# Patient Record
Sex: Female | Born: 1935
Health system: Southern US, Community
[De-identification: ages and names within clinical notes are randomized; demographics above are authoritative.]

## PROBLEM LIST (undated history)

## (undated) DIAGNOSIS — E785 Hyperlipidemia, unspecified: Secondary | ICD-10-CM

## (undated) DIAGNOSIS — F32A Depression, unspecified: Secondary | ICD-10-CM

## (undated) DIAGNOSIS — T7840XA Allergy, unspecified, initial encounter: Secondary | ICD-10-CM

## (undated) DIAGNOSIS — E079 Disorder of thyroid, unspecified: Secondary | ICD-10-CM

## (undated) DIAGNOSIS — K219 Gastro-esophageal reflux disease without esophagitis: Secondary | ICD-10-CM

## (undated) DIAGNOSIS — N952 Postmenopausal atrophic vaginitis: Secondary | ICD-10-CM

## (undated) DIAGNOSIS — R0602 Shortness of breath: Secondary | ICD-10-CM

## (undated) DIAGNOSIS — I10 Essential (primary) hypertension: Secondary | ICD-10-CM

## (undated) DIAGNOSIS — M170 Bilateral primary osteoarthritis of knee: Secondary | ICD-10-CM

## (undated) DIAGNOSIS — R7309 Other abnormal glucose: Secondary | ICD-10-CM

## (undated) DIAGNOSIS — N393 Stress incontinence (female) (male): Secondary | ICD-10-CM

## (undated) DIAGNOSIS — F329 Major depressive disorder, single episode, unspecified: Secondary | ICD-10-CM

## (undated) DIAGNOSIS — F419 Anxiety disorder, unspecified: Secondary | ICD-10-CM

## (undated) DIAGNOSIS — M1990 Unspecified osteoarthritis, unspecified site: Secondary | ICD-10-CM

## (undated) HISTORY — DX: Unspecified osteoarthritis, unspecified site: M19.90

## (undated) HISTORY — DX: Hyperlipidemia, unspecified: E78.5

## (undated) HISTORY — DX: Bilateral primary osteoarthritis of knee: M17.0

## (undated) HISTORY — DX: Postmenopausal atrophic vaginitis: N95.2

## (undated) HISTORY — DX: Stress incontinence (female) (male): N39.3

## (undated) HISTORY — DX: Anxiety disorder, unspecified: F41.9

## (undated) HISTORY — DX: Other abnormal glucose: R73.09

## (undated) HISTORY — DX: Shortness of breath: R06.02

## (undated) HISTORY — DX: Essential (primary) hypertension: I10

## (undated) HISTORY — DX: Depression, unspecified: F32.A

## (undated) HISTORY — PX: ABDOMINAL HYSTERECTOMY: SHX81

## (undated) HISTORY — DX: Disorder of thyroid, unspecified: E07.9

## (undated) HISTORY — DX: Gastro-esophageal reflux disease without esophagitis: K21.9

## (undated) HISTORY — DX: Major depressive disorder, single episode, unspecified: F32.9

## (undated) HISTORY — DX: Allergy, unspecified, initial encounter: T78.40XA

---

## 2004-09-04 ENCOUNTER — Ambulatory Visit: Payer: Self-pay | Admitting: Unknown Physician Specialty

## 2005-02-21 ENCOUNTER — Ambulatory Visit: Payer: Self-pay | Admitting: Internal Medicine

## 2005-09-29 ENCOUNTER — Ambulatory Visit: Payer: Self-pay

## 2005-09-30 ENCOUNTER — Ambulatory Visit: Payer: Self-pay | Admitting: Internal Medicine

## 2006-03-12 ENCOUNTER — Ambulatory Visit: Payer: Self-pay | Admitting: Internal Medicine

## 2006-03-16 ENCOUNTER — Ambulatory Visit: Payer: Self-pay | Admitting: Internal Medicine

## 2007-03-11 HISTORY — PX: ROTATOR CUFF REPAIR: SHX139

## 2007-04-09 ENCOUNTER — Ambulatory Visit: Payer: Self-pay | Admitting: Unknown Physician Specialty

## 2007-06-03 ENCOUNTER — Ambulatory Visit: Payer: Self-pay | Admitting: Internal Medicine

## 2007-08-17 ENCOUNTER — Ambulatory Visit: Payer: Self-pay | Admitting: Rheumatology

## 2007-11-08 ENCOUNTER — Ambulatory Visit: Payer: Self-pay | Admitting: Orthopedic Surgery

## 2007-11-11 ENCOUNTER — Ambulatory Visit: Payer: Self-pay | Admitting: Orthopedic Surgery

## 2007-11-11 ENCOUNTER — Other Ambulatory Visit: Payer: Self-pay

## 2007-12-03 ENCOUNTER — Ambulatory Visit: Payer: Self-pay | Admitting: Orthopedic Surgery

## 2008-06-06 ENCOUNTER — Ambulatory Visit: Payer: Self-pay | Admitting: Internal Medicine

## 2008-08-07 ENCOUNTER — Encounter: Payer: Self-pay | Admitting: Orthopedic Surgery

## 2008-08-08 ENCOUNTER — Encounter: Payer: Self-pay | Admitting: Orthopedic Surgery

## 2008-09-18 ENCOUNTER — Ambulatory Visit: Payer: Self-pay | Admitting: Orthopedic Surgery

## 2009-06-07 ENCOUNTER — Ambulatory Visit: Payer: Self-pay | Admitting: Internal Medicine

## 2009-06-14 ENCOUNTER — Ambulatory Visit: Payer: Self-pay | Admitting: Internal Medicine

## 2010-04-15 ENCOUNTER — Ambulatory Visit: Payer: Self-pay | Admitting: General Practice

## 2010-05-13 ENCOUNTER — Ambulatory Visit: Payer: Self-pay | Admitting: Anesthesiology

## 2010-05-15 ENCOUNTER — Ambulatory Visit: Payer: Self-pay | Admitting: General Practice

## 2012-04-09 ENCOUNTER — Ambulatory Visit: Payer: Self-pay | Admitting: Emergency Medicine

## 2012-04-09 LAB — RAPID STREP-A WITH REFLX: Micro Text Report: NEGATIVE

## 2012-04-12 LAB — BETA STREP CULTURE(ARMC)

## 2012-06-02 DIAGNOSIS — L292 Pruritus vulvae: Secondary | ICD-10-CM | POA: Insufficient documentation

## 2012-06-02 DIAGNOSIS — N952 Postmenopausal atrophic vaginitis: Secondary | ICD-10-CM | POA: Insufficient documentation

## 2012-11-23 ENCOUNTER — Ambulatory Visit: Payer: Self-pay | Admitting: Family Medicine

## 2013-01-06 ENCOUNTER — Ambulatory Visit: Payer: Self-pay | Admitting: Unknown Physician Specialty

## 2013-09-05 ENCOUNTER — Encounter: Payer: Self-pay | Admitting: Orthopedic Surgery

## 2013-09-07 ENCOUNTER — Encounter: Payer: Self-pay | Admitting: Orthopedic Surgery

## 2013-10-08 ENCOUNTER — Encounter: Payer: Self-pay | Admitting: Orthopedic Surgery

## 2014-02-17 DIAGNOSIS — R0602 Shortness of breath: Secondary | ICD-10-CM | POA: Insufficient documentation

## 2014-08-04 DIAGNOSIS — M1711 Unilateral primary osteoarthritis, right knee: Secondary | ICD-10-CM | POA: Insufficient documentation

## 2015-03-27 DIAGNOSIS — R7309 Other abnormal glucose: Secondary | ICD-10-CM | POA: Insufficient documentation

## 2015-04-13 ENCOUNTER — Other Ambulatory Visit: Payer: Self-pay | Admitting: Internal Medicine

## 2015-04-13 DIAGNOSIS — R05 Cough: Secondary | ICD-10-CM

## 2015-04-13 DIAGNOSIS — R091 Pleurisy: Secondary | ICD-10-CM

## 2015-04-13 DIAGNOSIS — R059 Cough, unspecified: Secondary | ICD-10-CM

## 2015-04-19 ENCOUNTER — Ambulatory Visit
Admission: RE | Admit: 2015-04-19 | Discharge: 2015-04-19 | Disposition: A | Discharge status: Home / Self Care | Payer: Medicare HMO | Source: Ambulatory Visit | Attending: Internal Medicine | Admitting: Internal Medicine

## 2015-04-19 DIAGNOSIS — M5134 Other intervertebral disc degeneration, thoracic region: Secondary | ICD-10-CM | POA: Diagnosis not present

## 2015-04-19 DIAGNOSIS — I2510 Atherosclerotic heart disease of native coronary artery without angina pectoris: Secondary | ICD-10-CM | POA: Diagnosis not present

## 2015-04-19 DIAGNOSIS — R059 Cough, unspecified: Secondary | ICD-10-CM

## 2015-04-19 DIAGNOSIS — E279 Disorder of adrenal gland, unspecified: Secondary | ICD-10-CM | POA: Insufficient documentation

## 2015-04-19 DIAGNOSIS — N63 Unspecified lump in breast: Secondary | ICD-10-CM | POA: Insufficient documentation

## 2015-04-19 DIAGNOSIS — R05 Cough: Secondary | ICD-10-CM | POA: Insufficient documentation

## 2015-04-19 DIAGNOSIS — R091 Pleurisy: Secondary | ICD-10-CM | POA: Insufficient documentation

## 2015-08-01 ENCOUNTER — Other Ambulatory Visit: Payer: Self-pay | Admitting: Internal Medicine

## 2015-08-01 DIAGNOSIS — R1310 Dysphagia, unspecified: Secondary | ICD-10-CM

## 2015-08-09 ENCOUNTER — Ambulatory Visit
Admission: RE | Admit: 2015-08-09 | Discharge: 2015-08-09 | Disposition: A | Discharge status: Home / Self Care | Payer: Medicare HMO | Source: Ambulatory Visit | Attending: Internal Medicine | Admitting: Internal Medicine

## 2015-08-09 DIAGNOSIS — R1310 Dysphagia, unspecified: Secondary | ICD-10-CM | POA: Diagnosis present

## 2015-08-24 DIAGNOSIS — M75121 Complete rotator cuff tear or rupture of right shoulder, not specified as traumatic: Secondary | ICD-10-CM | POA: Insufficient documentation

## 2016-02-07 DIAGNOSIS — I10 Essential (primary) hypertension: Secondary | ICD-10-CM | POA: Diagnosis not present

## 2016-02-22 ENCOUNTER — Ambulatory Visit: Admit: 2016-02-22 | Payer: Medicare HMO | Admitting: Unknown Physician Specialty

## 2016-02-22 SURGERY — ESOPHAGOGASTRODUODENOSCOPY (EGD) WITH PROPOFOL
Anesthesia: General

## 2016-04-29 DIAGNOSIS — H9201 Otalgia, right ear: Secondary | ICD-10-CM | POA: Diagnosis not present

## 2016-04-29 DIAGNOSIS — H6121 Impacted cerumen, right ear: Secondary | ICD-10-CM | POA: Diagnosis not present

## 2016-05-08 DIAGNOSIS — R42 Dizziness and giddiness: Secondary | ICD-10-CM | POA: Diagnosis not present

## 2016-05-08 DIAGNOSIS — R7309 Other abnormal glucose: Secondary | ICD-10-CM | POA: Diagnosis not present

## 2016-05-08 DIAGNOSIS — E079 Disorder of thyroid, unspecified: Secondary | ICD-10-CM | POA: Diagnosis not present

## 2016-05-08 DIAGNOSIS — E538 Deficiency of other specified B group vitamins: Secondary | ICD-10-CM | POA: Diagnosis not present

## 2016-05-08 DIAGNOSIS — Z79899 Other long term (current) drug therapy: Secondary | ICD-10-CM | POA: Diagnosis not present

## 2016-05-08 DIAGNOSIS — E7800 Pure hypercholesterolemia, unspecified: Secondary | ICD-10-CM | POA: Diagnosis not present

## 2016-05-08 DIAGNOSIS — I10 Essential (primary) hypertension: Secondary | ICD-10-CM | POA: Diagnosis not present

## 2016-07-04 DIAGNOSIS — I10 Essential (primary) hypertension: Secondary | ICD-10-CM | POA: Diagnosis not present

## 2016-07-04 DIAGNOSIS — E079 Disorder of thyroid, unspecified: Secondary | ICD-10-CM | POA: Diagnosis not present

## 2016-07-04 DIAGNOSIS — R7309 Other abnormal glucose: Secondary | ICD-10-CM | POA: Diagnosis not present

## 2016-07-04 DIAGNOSIS — E7800 Pure hypercholesterolemia, unspecified: Secondary | ICD-10-CM | POA: Diagnosis not present

## 2016-07-04 DIAGNOSIS — Z79899 Other long term (current) drug therapy: Secondary | ICD-10-CM | POA: Diagnosis not present

## 2016-07-10 DIAGNOSIS — Z1231 Encounter for screening mammogram for malignant neoplasm of breast: Secondary | ICD-10-CM | POA: Diagnosis not present

## 2016-08-22 DIAGNOSIS — M1711 Unilateral primary osteoarthritis, right knee: Secondary | ICD-10-CM | POA: Diagnosis not present

## 2016-08-22 DIAGNOSIS — M170 Bilateral primary osteoarthritis of knee: Secondary | ICD-10-CM | POA: Diagnosis not present

## 2016-08-29 DIAGNOSIS — M1711 Unilateral primary osteoarthritis, right knee: Secondary | ICD-10-CM | POA: Diagnosis not present

## 2016-09-30 DIAGNOSIS — I10 Essential (primary) hypertension: Secondary | ICD-10-CM | POA: Diagnosis not present

## 2016-09-30 DIAGNOSIS — R7309 Other abnormal glucose: Secondary | ICD-10-CM | POA: Diagnosis not present

## 2016-09-30 DIAGNOSIS — E7800 Pure hypercholesterolemia, unspecified: Secondary | ICD-10-CM | POA: Diagnosis not present

## 2016-09-30 DIAGNOSIS — E079 Disorder of thyroid, unspecified: Secondary | ICD-10-CM | POA: Diagnosis not present

## 2016-09-30 DIAGNOSIS — E538 Deficiency of other specified B group vitamins: Secondary | ICD-10-CM | POA: Diagnosis not present

## 2016-10-30 DIAGNOSIS — H2511 Age-related nuclear cataract, right eye: Secondary | ICD-10-CM | POA: Diagnosis not present

## 2016-12-12 DIAGNOSIS — Z23 Encounter for immunization: Secondary | ICD-10-CM | POA: Diagnosis not present

## 2017-02-06 DIAGNOSIS — R7309 Other abnormal glucose: Secondary | ICD-10-CM | POA: Diagnosis not present

## 2017-02-06 DIAGNOSIS — I10 Essential (primary) hypertension: Secondary | ICD-10-CM | POA: Diagnosis not present

## 2017-02-06 DIAGNOSIS — Z79899 Other long term (current) drug therapy: Secondary | ICD-10-CM | POA: Diagnosis not present

## 2017-02-06 DIAGNOSIS — E079 Disorder of thyroid, unspecified: Secondary | ICD-10-CM | POA: Diagnosis not present

## 2017-02-06 DIAGNOSIS — E7800 Pure hypercholesterolemia, unspecified: Secondary | ICD-10-CM | POA: Diagnosis not present

## 2017-02-06 DIAGNOSIS — E538 Deficiency of other specified B group vitamins: Secondary | ICD-10-CM | POA: Diagnosis not present

## 2017-03-08 DIAGNOSIS — M25561 Pain in right knee: Secondary | ICD-10-CM | POA: Diagnosis not present

## 2017-03-08 DIAGNOSIS — M1711 Unilateral primary osteoarthritis, right knee: Secondary | ICD-10-CM | POA: Diagnosis not present

## 2017-04-02 DIAGNOSIS — M1711 Unilateral primary osteoarthritis, right knee: Secondary | ICD-10-CM | POA: Diagnosis not present

## 2017-04-10 DEATH — deceased

## 2017-06-03 DIAGNOSIS — I10 Essential (primary) hypertension: Secondary | ICD-10-CM | POA: Diagnosis not present

## 2017-06-03 DIAGNOSIS — E7800 Pure hypercholesterolemia, unspecified: Secondary | ICD-10-CM | POA: Diagnosis not present

## 2017-06-03 DIAGNOSIS — Z0000 Encounter for general adult medical examination without abnormal findings: Secondary | ICD-10-CM | POA: Diagnosis not present

## 2017-06-03 DIAGNOSIS — R69 Illness, unspecified: Secondary | ICD-10-CM | POA: Diagnosis not present

## 2017-06-03 DIAGNOSIS — E079 Disorder of thyroid, unspecified: Secondary | ICD-10-CM | POA: Diagnosis not present

## 2017-06-03 DIAGNOSIS — Z79899 Other long term (current) drug therapy: Secondary | ICD-10-CM | POA: Diagnosis not present

## 2017-06-03 DIAGNOSIS — E538 Deficiency of other specified B group vitamins: Secondary | ICD-10-CM | POA: Diagnosis not present

## 2017-06-03 DIAGNOSIS — R7309 Other abnormal glucose: Secondary | ICD-10-CM | POA: Diagnosis not present

## 2017-07-10 DIAGNOSIS — M25561 Pain in right knee: Secondary | ICD-10-CM | POA: Diagnosis not present

## 2017-07-10 DIAGNOSIS — M1711 Unilateral primary osteoarthritis, right knee: Secondary | ICD-10-CM | POA: Diagnosis not present

## 2017-07-10 DIAGNOSIS — M25461 Effusion, right knee: Secondary | ICD-10-CM | POA: Diagnosis not present

## 2017-08-06 ENCOUNTER — Telehealth: Payer: Self-pay

## 2017-08-06 ENCOUNTER — Ambulatory Visit (INDEPENDENT_AMBULATORY_CARE_PROVIDER_SITE_OTHER): Payer: Medicare HMO | Admitting: Nurse Practitioner

## 2017-08-06 ENCOUNTER — Encounter: Payer: Self-pay | Admitting: Nurse Practitioner

## 2017-08-06 VITALS — BP 147/49 | HR 64 | Temp 98.2°F | Ht 59.0 in | Wt 123.2 lb

## 2017-08-06 DIAGNOSIS — Z7689 Persons encountering health services in other specified circumstances: Secondary | ICD-10-CM | POA: Diagnosis not present

## 2017-08-06 DIAGNOSIS — F419 Anxiety disorder, unspecified: Secondary | ICD-10-CM

## 2017-08-06 DIAGNOSIS — R42 Dizziness and giddiness: Secondary | ICD-10-CM | POA: Diagnosis not present

## 2017-08-06 DIAGNOSIS — E538 Deficiency of other specified B group vitamins: Secondary | ICD-10-CM | POA: Diagnosis not present

## 2017-08-06 DIAGNOSIS — E079 Disorder of thyroid, unspecified: Secondary | ICD-10-CM | POA: Diagnosis not present

## 2017-08-06 DIAGNOSIS — N952 Postmenopausal atrophic vaginitis: Secondary | ICD-10-CM | POA: Diagnosis not present

## 2017-08-06 DIAGNOSIS — E039 Hypothyroidism, unspecified: Secondary | ICD-10-CM | POA: Insufficient documentation

## 2017-08-06 DIAGNOSIS — E7800 Pure hypercholesterolemia, unspecified: Secondary | ICD-10-CM | POA: Insufficient documentation

## 2017-08-06 DIAGNOSIS — Z13220 Encounter for screening for lipoid disorders: Secondary | ICD-10-CM

## 2017-08-06 DIAGNOSIS — K219 Gastro-esophageal reflux disease without esophagitis: Secondary | ICD-10-CM

## 2017-08-06 DIAGNOSIS — R7309 Other abnormal glucose: Secondary | ICD-10-CM

## 2017-08-06 DIAGNOSIS — J00 Acute nasopharyngitis [common cold]: Secondary | ICD-10-CM

## 2017-08-06 DIAGNOSIS — R69 Illness, unspecified: Secondary | ICD-10-CM | POA: Diagnosis not present

## 2017-08-06 DIAGNOSIS — I10 Essential (primary) hypertension: Secondary | ICD-10-CM | POA: Insufficient documentation

## 2017-08-06 DIAGNOSIS — N393 Stress incontinence (female) (male): Secondary | ICD-10-CM | POA: Insufficient documentation

## 2017-08-06 MED ORDER — ESTROGENS, CONJUGATED 0.625 MG/GM VA CREA: 1.0000 g | TOPICAL_CREAM | VAGINAL | 12 refills | Status: AC

## 2017-08-06 MED ORDER — BUSPIRONE HCL 15 MG PO TABS: 15.0000 mg | ORAL_TABLET | Freq: Two times a day (BID) | ORAL | 5 refills | Status: DC

## 2017-08-06 MED ORDER — CLOBETASOL PROPIONATE 0.05 % EX CREA: TOPICAL_CREAM | CUTANEOUS | 5 refills | Status: AC

## 2017-08-06 MED ORDER — PANTOPRAZOLE SODIUM 40 MG PO TBEC: DELAYED_RELEASE_TABLET | ORAL | 1 refills | Status: DC

## 2017-08-06 NOTE — Patient Instructions (Addendum)
Cassandra Hahn,   Thank you for coming in to clinic today.  1. No medication changes today.    2. For your cold: - Continue Wal-Zyr - START Mucinex (guaifenesin) twice daily as needed for next 5-7 days.  Please schedule a follow-up appointment with [name], AGNP. Return in about 3 months (around 11/06/2017) for thyroid.  If you have any other questions or concerns, please feel free to call the clinic or send a message through MyChart. You may also schedule an earlier appointment if necessary.  You will receive a survey after today's visit either digitally by e-mail or paper by [employer]. Your experiences and feedback matter to [place].  Please respond so we know how we are doing as we provide care for you.   [name], DNP, AGNP-BC Adult Gerontology Nurse Practitioner [facility], [facility]
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
## 2017-08-07 ENCOUNTER — Encounter: Payer: Self-pay | Admitting: Nurse Practitioner

## 2017-08-07 DIAGNOSIS — F419 Anxiety disorder, unspecified: Secondary | ICD-10-CM | POA: Insufficient documentation

## 2017-08-07 LAB — COMPLETE METABOLIC PANEL WITH GFR
AG Ratio: 1.6 (calc) (ref 1.0–2.5)
ALT: 10 U/L (ref 6–29)
AST: 17 U/L (ref 10–35)
Albumin: 4.2 g/dL (ref 3.6–5.1)
Alkaline phosphatase (APISO): 81 U/L (ref 33–130)
BUN: 9 mg/dL (ref 7–25)
CO2: 27 mmol/L (ref 20–32)
Calcium: 9.4 mg/dL (ref 8.6–10.4)
Chloride: 102 mmol/L (ref 98–110)
Creat: 0.69 mg/dL (ref 0.60–0.88)
GFR, Est African American: 95 mL/min/{1.73_m2} (ref >=60)
GFR, Est Non African American: 82 mL/min/{1.73_m2} (ref >=60)
Globulin: 2.7 g/dL (calc) (ref 1.9–3.7)
Glucose, Bld: 84 mg/dL (ref 65–99)
Potassium: 4.4 mmol/L (ref 3.5–5.3)
Sodium: 140 mmol/L (ref 135–146)
Total Bilirubin: 0.4 mg/dL (ref 0.2–1.2)
Total Protein: 6.9 g/dL (ref 6.1–8.1)

## 2017-08-07 LAB — TSH: TSH: 1.49 mIU/L (ref 0.40–4.50)

## 2017-08-07 LAB — CBC WITH DIFFERENTIAL/PLATELET
Basophils Absolute: 18 cells/uL (ref 0–200)
Basophils Relative: 0.3 %
Eosinophils Absolute: 18 cells/uL (ref 15–500)
Eosinophils Relative: 0.3 %
HCT: 38.3 % (ref 35.0–45.0)
Hemoglobin: 12.2 g/dL (ref 11.7–15.5)
Lymphs Abs: 1500 cells/uL (ref 850–3900)
MCH: 28.8 pg (ref 27.0–33.0)
MCHC: 31.9 g/dL — ABNORMAL LOW (ref 32.0–36.0)
MCV: 90.5 fL (ref 80.0–100.0)
MPV: 9.6 fL (ref 7.5–12.5)
Monocytes Relative: 8 %
Neutro Abs: 3984 cells/uL (ref 1500–7800)
Neutrophils Relative %: 66.4 %
Platelets: 253 10*3/uL (ref 140–400)
RBC: 4.23 10*6/uL (ref 3.80–5.10)
RDW: 12 % (ref 11.0–15.0)
Total Lymphocyte: 25 %
WBC mixed population: 480 cells/uL (ref 200–950)
WBC: 6 10*3/uL (ref 3.8–10.8)

## 2017-08-07 LAB — LIPID PANEL
Cholesterol: 208 mg/dL — ABNORMAL HIGH (ref <200)
HDL: 74 mg/dL (ref >50)
LDL Cholesterol (Calc): 113 mg/dL (calc) — ABNORMAL HIGH
Non-HDL Cholesterol (Calc): 134 mg/dL (calc) — ABNORMAL HIGH (ref <130)
Total CHOL/HDL Ratio: 2.8 (calc) (ref <5.0)
Triglycerides: 103 mg/dL (ref <150)

## 2017-08-07 LAB — VITAMIN B12: Vitamin B-12: 868 pg/mL (ref 200–1100)

## 2017-08-10 ENCOUNTER — Other Ambulatory Visit: Payer: Self-pay | Admitting: Nurse Practitioner

## 2017-08-10 MED ORDER — LEVOTHYROXINE SODIUM 75 MCG PO TABS: ORAL_TABLET | ORAL | 1 refills | Status: DC

## 2017-08-10 NOTE — Telephone Encounter (Signed)
Open in error

## 2017-08-10 NOTE — Telephone Encounter (Signed)
Pt needs a ref[facility] on [facility]ine [facility].

## 2017-08-20 ENCOUNTER — Emergency Department
Admission: EM | Admit: 2017-08-20 | Discharge: 2017-08-20 | Disposition: A | Discharge status: Home / Self Care | Payer: Medicare HMO | Attending: Emergency Medicine | Admitting: Emergency Medicine

## 2017-08-20 ENCOUNTER — Emergency Department: Payer: Medicare HMO

## 2017-08-20 ENCOUNTER — Other Ambulatory Visit: Payer: Self-pay

## 2017-08-20 DIAGNOSIS — F419 Anxiety disorder, unspecified: Secondary | ICD-10-CM | POA: Insufficient documentation

## 2017-08-20 DIAGNOSIS — Y92481 Parking lot as the place of occurrence of the external cause: Secondary | ICD-10-CM | POA: Insufficient documentation

## 2017-08-20 DIAGNOSIS — N6321 Unspecified lump in the left breast, upper outer quadrant: Secondary | ICD-10-CM | POA: Insufficient documentation

## 2017-08-20 DIAGNOSIS — S0990XA Unspecified injury of head, initial encounter: Secondary | ICD-10-CM | POA: Insufficient documentation

## 2017-08-20 DIAGNOSIS — S0121XA Laceration without foreign body of nose, initial encounter: Secondary | ICD-10-CM

## 2017-08-20 DIAGNOSIS — Y9389 Activity, other specified: Secondary | ICD-10-CM | POA: Insufficient documentation

## 2017-08-20 DIAGNOSIS — R0789 Other chest pain: Secondary | ICD-10-CM | POA: Diagnosis not present

## 2017-08-20 DIAGNOSIS — N63 Unspecified lump in unspecified breast: Secondary | ICD-10-CM | POA: Diagnosis not present

## 2017-08-20 DIAGNOSIS — F329 Major depressive disorder, single episode, unspecified: Secondary | ICD-10-CM | POA: Insufficient documentation

## 2017-08-20 DIAGNOSIS — Y998 Other external cause status: Secondary | ICD-10-CM | POA: Diagnosis not present

## 2017-08-20 DIAGNOSIS — M542 Cervicalgia: Secondary | ICD-10-CM | POA: Diagnosis not present

## 2017-08-20 DIAGNOSIS — I10 Essential (primary) hypertension: Secondary | ICD-10-CM | POA: Insufficient documentation

## 2017-08-20 DIAGNOSIS — R69 Illness, unspecified: Secondary | ICD-10-CM | POA: Diagnosis not present

## 2017-08-20 DIAGNOSIS — S299XXA Unspecified injury of thorax, initial encounter: Secondary | ICD-10-CM | POA: Diagnosis not present

## 2017-08-20 DIAGNOSIS — Z79899 Other long term (current) drug therapy: Secondary | ICD-10-CM | POA: Diagnosis not present

## 2017-08-20 DIAGNOSIS — Z23 Encounter for immunization: Secondary | ICD-10-CM | POA: Diagnosis not present

## 2017-08-20 DIAGNOSIS — S0993XA Unspecified injury of face, initial encounter: Secondary | ICD-10-CM | POA: Diagnosis not present

## 2017-08-20 DIAGNOSIS — S199XXA Unspecified injury of neck, initial encounter: Secondary | ICD-10-CM | POA: Diagnosis not present

## 2017-08-20 DIAGNOSIS — W19XXXA Unspecified fall, initial encounter: Secondary | ICD-10-CM | POA: Diagnosis not present

## 2017-08-20 MED ORDER — TETANUS-DIPHTH-ACELL PERTUSSIS 5-2.5-18.5 LF-MCG/0.5 IM SUSP
0.5000 mL | Freq: Once | INTRAMUSCULAR | Status: AC
Start: 2017-08-20 — End: 2017-08-20
  Administered 2017-08-20: 0.5 mL via INTRAMUSCULAR
  Filled 2017-08-20: qty 0.5
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
## 2017-08-21 ENCOUNTER — Ambulatory Visit (INDEPENDENT_AMBULATORY_CARE_PROVIDER_SITE_OTHER): Payer: Self-pay | Admitting: Vascular Surgery

## 2017-08-21 ENCOUNTER — Encounter (INDEPENDENT_AMBULATORY_CARE_PROVIDER_SITE_OTHER): Payer: Self-pay

## 2017-08-21 DIAGNOSIS — H524 Presbyopia: Secondary | ICD-10-CM | POA: Diagnosis not present

## 2017-09-03 ENCOUNTER — Ambulatory Visit (INDEPENDENT_AMBULATORY_CARE_PROVIDER_SITE_OTHER): Payer: Medicare HMO | Admitting: Nurse Practitioner

## 2017-09-03 ENCOUNTER — Other Ambulatory Visit: Payer: Self-pay

## 2017-09-03 DIAGNOSIS — R269 Unspecified abnormalities of gait and mobility: Secondary | ICD-10-CM
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
## 2017-09-08 ENCOUNTER — Ambulatory Visit: Payer: Medicare HMO | Attending: Nurse Practitioner | Admitting: Physical Therapy

## 2017-09-08 ENCOUNTER — Encounter: Payer: Self-pay | Admitting: Physical Therapy

## 2017-09-08 DIAGNOSIS — M6281 Muscle weakness (generalized): Secondary | ICD-10-CM

## 2017-09-08 DIAGNOSIS — R262 Difficulty in walking, not elsewhere classified: Secondary | ICD-10-CM | POA: Insufficient documentation

## 2017-09-08 DIAGNOSIS — R2681 Unsteadiness on feet: Secondary | ICD-10-CM | POA: Diagnosis not present

## 2017-09-08 NOTE — Therapy (Addendum)
[facility] MAIN [facility] SERVICES [address] [place], [place], [address] Phone: [phone]   Fax:  [phone]  Physical Therapy Treatment  Patient Details  Name: Cassandra Hahn MRN: [id number] Date of Birth: [birth year] Referring Provider: [name] RENEE    Encounter Date: 09/08/2017  PT End of Session - 09/08/17 0952    Visit Number  1    Number of Visits  17    Date for PT Re-Evaluation  11/03/17    PT Start Time  0932    PT Stop Time  1030    PT Time Calculation (min)  58 min    Equipment Utilized During Treatment  Gait belt    Activity Tolerance  Patient tolerated treatment well;Patient limited by fatigue    Behavior During Therapy  [facility] for tasks assessed/performed       Past Medical History:  Diagnosis Date  . Abnormal glucose   . Allergy   . Anxiety   . Atrophic vaginitis   . Depression   . GERD (gastroesophageal reflux disease)   . Hyperlipidemia   . Hypertension   . Osteoarthritis   . Primary osteoarthritis of both knees   . SOB (shortness of breath)   . Stress incontinence, female   . Thyroid disease     Past Surgical History:  Procedure Laterality Date  . ABDOMINAL HYSTERECTOMY    . ROTATOR CUFF REPAIR Right 2009    There were no vitals filed for this visit.  Subjective Assessment - 09/08/17 0944    Subjective  Patient has poor balance.     Pertinent History  She reports that she is using the spc for 6 months and she has had one fall.     Patient Stated Goals  she wants her balance to get better.     Currently in Pain?  Yes    Pain Score  4     Pain Location  Knee    Pain Orientation  Right    Pain Descriptors / Indicators  Aching    Pain Onset  More than a month ago    Pain Frequency  Intermittent    Aggravating Factors   getting up and down    Effect of Pain on Daily Activities  hard to get up    Multiple Pain Sites  No         [facility] PT Assessment - 09/08/17 0948      Assessment   Medical  Diagnosis  difficulty with walking    Referring Provider  [name] RENEE     Hand Dominance  Right    Prior Therapy  -- 4 or 5 years ago      Precautions   Precautions  Fall      Restrictions   Weight Bearing Restrictions  No      Balance Screen   Has the patient fallen in the past 6 months  Yes    How many times?  1    Has the patient had a decrease in activity level because of a fear of falling?   Yes    Is the patient reluctant to leave their home because of a fear of falling?   No      Home Environment   Living Environment  Private residence    Living Arrangements  Alone    Available Help at Discharge  Friend(s)    Type of Home  Apartment    Home Access  Level entry  Home Layout  One level    Home Equipment  Gilmer Mor - single point      Prior Function   Level of Independence  Independent with basic ADLs;Independent;Independent with household mobility with device;Independent with community mobility with device    Vocation  Retired    Leisure  church, bingo          POSTURE: WNL standing    PROM/AROM: WFL BUE and BLE    Decreased flexibility with hip flexion with mild tightness bilaterally  STRENGTH:  Graded on a 0-5 scale Muscle Group Left Right  Shoulder flex                        Hip Flex 3/5 3/5  Hip Abd 3/5 3/5  Hip Add 3/5 3/5  Hip Ext 3/5 3/5      Knee Flex 4/5 4/5  Knee Ext 4/5 4/5  Ankle DF 4/5 4/5  Ankle PF 4/5 4/5   SENSATION: WNL  FUNCTIONAL MOBILITY: guarded due to back pain and slow mobility but independent  Sit to stand transfer with definite use of hands and takes several attempts to rise up    BALANCE: Standing Dynamic Balance  Normal Stand independently unsupported, able to weight shift and cross midline maximally   Good Stand independently unsupported, able to weight shift and cross midline moderately   Good-/Fair+ Stand independently unsupported, able to weight shift across midline minimally   Fair Stand  independently unsupported, weight shift, and reach ipsilaterally, loss of balance when crossing midline   Poor+ Able to stand with Min A and reach ipsilaterally, unable to weight shift x  Poor Able to stand with Mod A and minimally reach ipsilaterally, unable to cross midline.    Static Standing Balance  Normal Able to maintain standing balance against maximal resistance   Good Able to maintain standing balance against moderate resistance   Good-/Fair+ Able to maintain standing balance against minimal resistance   Fair Able to stand unsupported without UE support and without LOB for 1-2 min x  Fair- Requires Min A and UE support to maintain standing without loss of balance   Poor+ Requires mod A and UE support to maintain standing without loss of balance   Poor Requires max A and UE support to maintain standing balance without loss       GAIT:   Patient ambulates with spc intermediate distances,  with slow gait speed and path deviation   OUTCOME MEASURES: TEST Outcome Interpretation  5 times sit<>stand 63 sec >60 yo, >15 sec indicates increased risk for falls  10 meter walk test   .43              m/s <1.0 m/s indicates increased risk for falls; limited community ambulator  Timed up and Go 36.88                sec <14 sec indicates increased risk for falls  6 minute walk test       410         Feet 1000 feet is community ambulator            Treatment:  Standing hip abd with YTB 10 x 2 x 2 sets left and right   Standing hip ext  with YTB 10 x 2 x 2 sets left and right   Standing squats  10 x 2 x 2 sets   Standing heel raises 10 x 2 x 2 sets  Reviewed HEP and given handout                    PT Education - 09/08/17 0950    Education Details  plan of care    Person(s) Educated  Patient    Methods  Explanation    Comprehension  Verbalized understanding       PT Short Term Goals - 09/08/17 1022      PT SHORT TERM GOAL #1   Title  Patient will increase  BLE gross strength to 4+/5 as to improve functional strength for independent gait, increased standing tolerance and increased ADL ability.    Time  4    Period  Weeks    Status  New    Target Date  10/06/17        PT Long Term Goals - 09/08/17 1022      PT LONG TERM GOAL #1   Title  Patient will be independent in home exercise program to improve strength/mobility for better functional independence with ADLs.    Time  8    Period  Weeks    Status  New    Target Date  11/03/17      PT LONG TERM GOAL #2   Title  Patient (> 81 years old) will complete five times sit to stand test in < 15 seconds indicating an increased LE strength and improved balance.    Time  8    Period  Weeks    Status  New    Target Date  11/03/17      PT LONG TERM GOAL #3   Title  Patient will increase six minute walk test distance to >1000 for progression to community ambulator and improve gait ability    Time  8    Period  Weeks    Status  New    Target Date  11/03/17      PT LONG TERM GOAL #4   Title  Patient will increase 10 meter walk test to >1.3842m/s as to improve gait speed for better community ambulation and to reduce fall risk.    Time  8    Period  Weeks    Status  New    Target Date  11/03/17            Plan - 09/08/17 1018    Clinical Impression Statement  Patient has dx of gait instability and has deficits in strength of 3/5, decreased flexibility of hips, gait with spc and static and  dynamic standing balance deficits. Patient has outcome measures that indicate a falls risk.  Patient will benefit from  skilled PT to improve dynamic standing balance, strength, gait, and reach functional goals. Patient needs several rest periods throughout treatment due to fatigue.   Clinical Presentation due to:  fall and lives alone    Rehab Potential  Good    Clinical Impairments Affecting Rehab Potential  hearing loss    PT Frequency  2x / week    PT Duration  6 weeks    PT Treatment/Interventions   Manual techniques;Balance training;Neuromuscular re-education;Functional mobility training;Therapeutic exercise;Therapeutic activities;Gait training    PT Next Visit Plan  static and dynamic standing balance training    Consulted and Agree with Plan of Care  Patient       Patient will benefit from skilled therapeutic intervention in order to improve the following deficits and impairments:  Abnormal gait, Decreased balance, Decreased endurance, Decreased mobility, Difficulty walking, Pain, Decreased activity  tolerance, Decreased knowledge of use of DME, Decreased safety awareness, Decreased strength  Visit Diagnosis: Difficulty in walking, not elsewhere classified  Muscle weakness (generalized)  Unsteadiness on feet     Problem List Patient Active Problem List   Diagnosis Date Noted  . Anxiety 08/07/2017  . Thyroid disease 08/06/2017  . Stress incontinence 08/06/2017  . Hypertension 08/06/2017  . Hypercholesterolemia 08/06/2017  . GERD without esophagitis 08/06/2017  . B12 deficiency 08/06/2017  . Vertigo 08/06/2017  . Complete tear of right rotator cuff 08/24/2015  . Abnormal glucose 03/27/2015  . Primary osteoarthritis of right knee 08/04/2014  . SOB (shortness of breath) 02/17/2014  . Vulvar itching 06/02/2012  . Perimenopausal atrophic vaginitis 06/02/2012    Ezekiel Ina, PT DPT 09/08/2017, 10:30 AM  [facility] MAIN [facility] SERVICES [address] [place], [place], [address] Phone: [phone]   Fax:  [phone]  Name: Cassandra Hahn MRN: [id number] Date of Birth: [birth year]

## 2017-09-08 NOTE — Patient Instructions (Signed)
Heel Raise: Bilateral (Standing)    Rise on balls of feet. Repeat _15__ times per set. Do _2___ sets per session. Do __7__ sessions per day.  http://orth.exer.us/39   Copyright  VHI. All rights reserved.  Hip Extension (Standing)    Stand with support. Squeeze pelvic floor and hold. Move right leg backward with straight knee.  Repeat _15__ times. Do __2_ times a day. Repeat with other leg.    Copyright  VHI. All rights reserved.  HIP: Abduction - Standing (Band)    Place band around legs. Squeeze glutes. Raise leg out and slightly back.. Use  band. 15___ reps per set, _2__ sets per day, _7__ days per week Hold onto a support.  Copyright  VHI. All rights reserved.

## 2017-09-08 NOTE — Addendum Note (Signed)
Addended by: Ezekiel InaMANSFIELD, Nashika Coker S on: 09/08/2017 02:18 PM   Modules accepted: Orders

## 2017-09-16 ENCOUNTER — Ambulatory Visit: Payer: Medicare HMO | Admitting: Physical Therapy

## 2017-09-17 ENCOUNTER — Encounter: Payer: Medicare HMO | Admitting: Physical Therapy

## 2017-09-17 DIAGNOSIS — H9190 Unspecified hearing loss, unspecified ear: Secondary | ICD-10-CM | POA: Diagnosis not present

## 2017-09-17 DIAGNOSIS — I10 Essential (primary) hypertension: Secondary | ICD-10-CM | POA: Diagnosis not present

## 2017-09-17 DIAGNOSIS — Z1231 Encounter for screening mammogram for malignant neoplasm of breast: Secondary | ICD-10-CM | POA: Diagnosis not present

## 2017-09-17 DIAGNOSIS — Z9289 Personal history of other medical treatment: Secondary | ICD-10-CM | POA: Diagnosis not present

## 2017-09-17 DIAGNOSIS — N6099 Unspecified benign mammary dysplasia of unspecified breast: Secondary | ICD-10-CM | POA: Diagnosis not present

## 2017-09-17 DIAGNOSIS — N6091 Unspecified benign mammary dysplasia of right breast: Secondary | ICD-10-CM | POA: Diagnosis not present

## 2017-09-17 DIAGNOSIS — R921 Mammographic calcification found on diagnostic imaging of breast: Secondary | ICD-10-CM | POA: Diagnosis not present

## 2017-09-22 ENCOUNTER — Encounter: Payer: Self-pay | Admitting: Physical Therapy

## 2017-09-22 ENCOUNTER — Ambulatory Visit: Payer: Medicare HMO | Admitting: Physical Therapy

## 2017-09-22 DIAGNOSIS — R2681 Unsteadiness on feet: Secondary | ICD-10-CM | POA: Diagnosis not present

## 2017-09-22 DIAGNOSIS — R262 Difficulty in walking, not elsewhere classified: Secondary | ICD-10-CM

## 2017-09-22 DIAGNOSIS — M6281 Muscle weakness (generalized): Secondary | ICD-10-CM

## 2017-09-22 NOTE — Therapy (Signed)
[facility] MAIN [facility] SERVICES [address] [place], [place], [address] Phone: [phone]   Fax:  [phone]  Physical Therapy Treatment  Patient Details  Name: Cassandra Hahn MRN: [id number] Date of Birth: 13-May-1934 Referring Provider: [name] RENEE    Encounter Date: 09/22/2017  PT End of Session - 09/22/17 0859    Visit Number  2    Number of Visits  17    Date for PT Re-Evaluation  11/03/17    PT Start Time  0952    PT Stop Time  1030    PT Time Calculation (min)  38 min    Equipment Utilized During Treatment  Gait belt    Activity Tolerance  Patient tolerated treatment well;Patient limited by fatigue    Behavior During Therapy  [facility] for tasks assessed/performed       Past Medical History:  Diagnosis Date  . Abnormal glucose   . Allergy   . Anxiety   . Atrophic vaginitis   . Depression   . GERD (gastroesophageal reflux disease)   . Hyperlipidemia   . Hypertension   . Osteoarthritis   . Primary osteoarthritis of both knees   . SOB (shortness of breath)   . Stress incontinence, female   . Thyroid disease     Past Surgical History:  Procedure Laterality Date  . ABDOMINAL HYSTERECTOMY    . ROTATOR CUFF REPAIR Right 2009    There were no vitals filed for this visit.  Subjective Assessment - 09/22/17 0857    Subjective  Patient has poor balance. Her right knee is hurting 6/10.     Pertinent History  She reports that she is using the spc for 6 months and she has had one fall.     Patient Stated Goals  she wants her balance to get better.     Pain Score  6     Pain Location  Knee    Pain Orientation  Left    Pain Descriptors / Indicators  Aching    Pain Onset  More than a month ago    Pain Frequency  Constant    Aggravating Factors   walking    Effect of Pain on Daily Activities  difficult to do activities         Treatment: Hip abd with YTB x 10 BLE x 2 sets Hip ext with YTB x 10 x 2 sets BLE  Hip flex  with YTB x 10 x 2 BLE  Heel raises x 20 x 1  Side stepping with YTB 10 feet x 5 lengths in parallel bars Leg press with 60 lbs x 20 x 2 Squats with 3 sec x 10  Side stepping on blue foam left and right with cues to reduce UE assist and to look up x 10 feet x 5  Patient has multiple loss of balance during side stepping on foam CGA and Min to mod verbal cues used throughout with increased in postural sway and LOB most seen with narrow base of support and while on uneven surfaces. Continues to have balance deficits typical with diagnosis. Patient performs intermediate level exercises without pain behaviors and needs verbal cuing for postural alignment and head positioning                       PT Education - 09/22/17 0859    Education Details  HEP    Person(s) Educated  Patient    Methods  Explanation    Comprehension  Verbalized understanding       PT Short Term Goals - 09/08/17 1022      PT SHORT TERM GOAL #1   Title  Patient will increase BLE gross strength to 4+/5 as to improve functional strength for independent gait, increased standing tolerance and increased ADL ability.    Time  4    Period  Weeks    Status  New    Target Date  10/06/17        PT Long Term Goals - 09/08/17 1022      PT LONG TERM GOAL #1   Title  Patient will be independent in home exercise program to improve strength/mobility for better functional independence with ADLs.    Time  8    Period  Weeks    Status  New    Target Date  11/03/17      PT LONG TERM GOAL #2   Title  Patient (> 81 years old) will complete five times sit to stand test in < 15 seconds indicating an increased LE strength and improved balance.    Time  8    Period  Weeks    Status  New    Target Date  11/03/17      PT LONG TERM GOAL #3   Title  Patient will increase six minute walk test distance to >1000 for progression to community ambulator and improve gait ability    Time  8    Period  Weeks    Status  New     Target Date  11/03/17      PT LONG TERM GOAL #4   Title  Patient will increase 10 meter walk test to >1.4659m/s as to improve gait speed for better community ambulation and to reduce fall risk.    Time  8    Period  Weeks    Status  New    Target Date  11/03/17            Plan - 09/22/17 0901    Clinical Impression Statement  Pt requires direction and verbal cues for correct performance of exercises. Patient demonstrates weakness in BLE and performs open and closed chain exercises with minimal reports of pain to left knee. Pt was able to perform all exercises with max assist and VC for technique..   Patient struggles with speed during movement as well as balance with unstable surfaces.  Pt encouraged continuing new supine HEP .Follow-up as scheduled.    Rehab Potential  Good    Clinical Impairments Affecting Rehab Potential  hearing loss    PT Frequency  2x / week    PT Duration  8 weeks    PT Treatment/Interventions  Manual techniques;Balance training;Neuromuscular re-education;Functional mobility training;Therapeutic exercise;Therapeutic activities;Gait training    PT Next Visit Plan  static and dynamic standing balance training    Consulted and Agree with Plan of Care  Patient       Patient will benefit from skilled therapeutic intervention in order to improve the following deficits and impairments:  Abnormal gait, Decreased balance, Decreased endurance, Decreased mobility, Difficulty walking, Pain, Decreased activity tolerance, Decreased knowledge of use of DME, Decreased safety awareness, Decreased strength  Visit Diagnosis: Difficulty in walking, not elsewhere classified  Muscle weakness (generalized)  Unsteadiness on feet     Problem List Patient Active Problem List   Diagnosis Date Noted  . Anxiety 08/07/2017  . Thyroid disease 08/06/2017  . Stress incontinence 08/06/2017  .  Hypertension 08/06/2017  . Hypercholesterolemia 08/06/2017  . GERD without esophagitis  08/06/2017  . B12 deficiency 08/06/2017  . Vertigo 08/06/2017  . Complete tear of right rotator cuff 08/24/2015  . Abnormal glucose 03/27/2015  . Primary osteoarthritis of right knee 08/04/2014  . SOB (shortness of breath) 02/17/2014  . Vulvar itching 06/02/2012  . Perimenopausal atrophic vaginitis 06/02/2012    Ezekiel Ina, [place] DPT 09/22/2017, 9:55 AM  [facility] MAIN [facility] SERVICES [address] [place], [place], [address] Phone: [phone]   Fax:  [phone]  Name: Cassandra Hahn MRN: [id number] Date of Birth: 02-Oct-1934

## 2017-09-23 ENCOUNTER — Encounter: Payer: Self-pay | Admitting: Physical Therapy

## 2017-09-24 ENCOUNTER — Encounter: Payer: Self-pay | Admitting: Physical Therapy

## 2017-09-24 ENCOUNTER — Ambulatory Visit: Payer: Medicare HMO | Admitting: Physical Therapy

## 2017-09-24 DIAGNOSIS — M6281 Muscle weakness (generalized): Secondary | ICD-10-CM | POA: Diagnosis not present

## 2017-09-24 DIAGNOSIS — R2681 Unsteadiness on feet: Secondary | ICD-10-CM | POA: Diagnosis not present

## 2017-09-24 DIAGNOSIS — R262 Difficulty in walking, not elsewhere classified: Secondary | ICD-10-CM | POA: Diagnosis not present

## 2017-09-24 NOTE — Therapy (Signed)
[place] [facility]IN [facility] [address] [place], [place], [address] Phone: [phone]   Fax:  [phone]  Physical Therapy Treatment  Patient Details  Name: Cassandra Hahn MRN: [id number] Date of Birth: 05-28-1934 Referring Provider: Wilhelmina McardleKENNEDY, LAUREN RENEE    Encounter Date: 09/24/2017  PT End of Session - 09/24/17 0957    Visit Number  3    Number of Visits  17    Date for PT Re-Evaluation  11/03/17    PT Start Time  0941    PT Stop Time  1021    PT Time Calculation (min)  40 min    Equipment Utilized During Treatment  Gait belt    Activity Tolerance  Patient tolerated treatment well;Patient limited by fatigue    Behavior During Therapy  [facility] for tasks assessed/performed       Past Medical History:  Diagnosis Date  . Abnormal glucose   . Allergy   . Anxiety   . Atrophic vaginitis   . Depression   . GERD (gastroesophageal reflux disease)   . Hyperlipidemia   . Hypertension   . Osteoarthritis   . Primary osteoarthritis of both knees   . SOB (shortness of breath)   . Stress incontinence, female   . Thyroid disease     Past Surgical History:  Procedure Laterality Date  . ABDOMINAL HYSTERECTOMY    . ROTATOR CUFF REPAIR Right 2009    There were no vitals filed for this visit.  Subjective Assessment - 09/24/17 0956    Subjective  Patient has poor balance. Her right knee is hurting 6/10.     Pertinent History  She reports that she is using the spc for 6 months and she has had one fall.     Patient Stated Goals  she wants her balance to get better.     Currently in Pain?  Yes    Pain Score  4     Pain Location  Knee    Pain Orientation  Right    Pain Descriptors / Indicators  Aching    Pain Onset  More than a month ago    Multiple Pain Sites  No       Treatment: Nu-step x 3 mins for warm up   Standing hip abd with OTB x 10 x 2 BLE  Standing hip ext  with OTB x 10 x 2 BLE  Standing squats x 10 with 5 sec hold    Heel raises with poor quality due to weakness x 10 with UE support  Side stepping with OTB x 5 feet x 5 laps with UE support   Supine bridges x 10 x 2   Sidelying hip abd x 10 x 2 BLE with poor positioning and cues to get all the way onto his side   Patient is having great fatigue and difficulty with positioning but has no reports of pain.                         PT Education - 09/24/17 0957    Education Details  HEP    Person(s) Educated  Patient    Methods  Explanation    Comprehension  Verbalized understanding       PT Short Term Goals - 09/08/17 1022      PT SHORT TERM GOAL #1   Title  Patient will increase BLE gross strength to 4+/5 as to improve functional strength for independent gait,  increased standing tolerance and increased ADL ability.    Time  4    Period  Weeks    Status  New    Target Date  10/06/17        PT Long Term Goals - 09/08/17 1022      PT LONG TERM GOAL #1   Title  Patient will be independent in home exercise program to improve strength/mobility for better functional independence with ADLs.    Time  8    Period  Weeks    Status  New    Target Date  11/03/17      PT LONG TERM GOAL #2   Title  Patient (> 5 years old) will complete five times sit to stand test in < 15 seconds indicating an increased LE strength and improved balance.    Time  8    Period  Weeks    Status  New    Target Date  11/03/17      PT LONG TERM GOAL #3   Title  Patient will increase six minute walk test distance to >1000 for progression to community ambulator and improve gait ability    Time  8    Period  Weeks    Status  New    Target Date  11/03/17      PT LONG TERM GOAL #4   Title  Patient will increase 10 meter walk test to >1.54m/s as to improve gait speed for better community ambulation and to reduce fall risk.    Time  8    Period  Weeks    Status  New    Target Date  11/03/17            Plan - 09/24/17 0958     Clinical Impression Statement  Patient instructed in beginning  LE strengthening in standing, supine, and seated as part of HEP. Patient required mod VCs to improve technique and to improve positioning for better strengthening. Patient performs open and closed chain exercises in standing and supine with fatigue after 10 reps and no reports  of pain.  Patinet  would benefit from additional skilled PT intervention to improve balance/gait safety and reduce fall risk.    Rehab Potential  Good    Clinical Impairments Affecting Rehab Potential  hearing loss    PT Frequency  2x / week    PT Duration  8 weeks    PT Treatment/Interventions  Manual techniques;Balance training;Neuromuscular re-education;Functional mobility training;Therapeutic exercise;Therapeutic activities;Gait training    PT Next Visit Plan  static and dynamic standing balance training    Consulted and Agree with Plan of Care  Patient       Patient will benefit from skilled therapeutic intervention in order to improve the following deficits and impairments:  Abnormal gait, Decreased balance, Decreased endurance, Decreased mobility, Difficulty walking, Pain, Decreased activity tolerance, Decreased knowledge of use of DME, Decreased safety awareness, Decreased strength  Visit Diagnosis: Difficulty in walking, not elsewhere classified  Muscle weakness (generalized)  Unsteadiness on feet     Problem List Patient Active Problem List   Diagnosis Date Noted  . Anxiety 08/07/2017  . Thyroid disease 08/06/2017  . Stress incontinence 08/06/2017  . Hypertension 08/06/2017  . Hypercholesterolemia 08/06/2017  . GERD without esophagitis 08/06/2017  . B12 deficiency 08/06/2017  . Vertigo 08/06/2017  . Complete tear of right rotator cuff 08/24/2015  . Abnormal glucose 03/27/2015  . Primary osteoarthritis of right knee 08/04/2014  . SOB (shortness of breath)  02/17/2014  . Vulvar itching 06/02/2012  . Perimenopausal atrophic vaginitis  06/02/2012    Ezekiel Ina, [place] DPT 09/24/2017, 10:00 AM  [place] [facility] MAIN [facility] SERVICES [address] [place], [place], [address] Phone: [phone]   Fax:  [phone]  Name: Cassandra Hahn MRN: [id number] Date of Birth: 05-29-1934

## 2017-09-28 ENCOUNTER — Encounter: Payer: Self-pay | Admitting: Physical Therapy

## 2017-09-28 ENCOUNTER — Ambulatory Visit: Payer: Medicare HMO | Admitting: Physical Therapy

## 2017-09-28 DIAGNOSIS — R2681 Unsteadiness on feet: Secondary | ICD-10-CM | POA: Diagnosis not present

## 2017-09-28 DIAGNOSIS — M6281 Muscle weakness (generalized): Secondary | ICD-10-CM | POA: Diagnosis not present

## 2017-09-28 DIAGNOSIS — R262 Difficulty in walking, not elsewhere classified: Secondary | ICD-10-CM

## 2017-09-28 NOTE — Therapy (Signed)
[place] [facility] MAIN [facility] SERVICES [address] [place], [place], [address] Phone: [phone]   Fax:  [phone]  Physical Therapy Treatment  Patient Details  Name: Cassandra Hahn MRN: [id number] Date of Birth: [birth year] Referring Provider: [name] RENEE    Encounter Date: 09/28/2017  PT End of Session - 09/28/17 0944    Visit Number  4    Number of Visits  17    Date for PT Re-Evaluation  11/03/17    PT Start Time  0932    PT Stop Time  1014    PT Time Calculation (min)  42 min    Equipment Utilized During Treatment  Gait belt    Activity Tolerance  Patient tolerated treatment well;Patient limited by fatigue    Behavior During Therapy  [facility] for tasks assessed/performed       Past Medical History:  Diagnosis Date  . Abnormal glucose   . Allergy   . Anxiety   . Atrophic vaginitis   . Depression   . GERD (gastroesophageal reflux disease)   . Hyperlipidemia   . Hypertension   . Osteoarthritis   . Primary osteoarthritis of both knees   . SOB (shortness of breath)   . Stress incontinence, female   . Thyroid disease     Past Surgical History:  Procedure Laterality Date  . ABDOMINAL HYSTERECTOMY    . ROTATOR CUFF REPAIR Right 2009    There were no vitals filed for this visit.  Subjective Assessment - 09/28/17 0939    Subjective  Patient has poor balance. Her right knee is hurting 5/10.     Pertinent History  She reports that she is using the spc for 6 months and she has had one fall.     Patient Stated Goals  she wants her balance to get better.     Pain Score  5     Pain Location  Knee    Pain Orientation  Left    Pain Descriptors / Indicators  Aching    Pain Onset  More than a month ago    Aggravating Factors   lying down    Multiple Pain Sites  No       Therapeutic exercise: Squats x 15 with 5 sec hold  LAQ with 2 # BLE x 15 x 2 with 3 sec hold Marching with 2 # x 15 x 2 BLE  Hip extension standing with knee  ext x 15 BLE Hip abd sidelying left and right x 15 BLE hooklying abd/ER x 15 x 2 with RTB Hooklying marching with 2 lbs x 15 x 2 Bridges x 10 Standing hip abd x 15 x 2  BLE  Neuromuscular training: Foam flat side up and balance with head turns left and right feet apart and feet together,cues for better posture Side stepping  left and right x 10 lengths, cues for going slowly and she needs UE support with LOB backwards Modified  tandem standing  , cues for better posture  step ups from floor to 6 inch stool x 20 bilateral marching in parallel bars x 20, cues for bigger steps Stepping over bolster left and right and fwd/bwd, needs UE support with LOB backwards    CGA and Min to mod verbal cues used throughout with increased in postural sway and LOB most seen with narrow base of support and while on uneven surfaces. Continues to have balance deficits typical with diagnosis. Patient performs intermediate level exercises without pain  behaviors and needs verbal cuing for postural alignment and head positioning                    PT Education - 09/28/17 0939    Education Details  HEP    Person(s) Educated  Patient    Methods  Explanation    Comprehension  Verbalized understanding;Returned demonstration       PT Short Term Goals - 09/08/17 1022      PT SHORT TERM GOAL #1   Title  Patient will increase BLE gross strength to 4+/5 as to improve functional strength for independent gait, increased standing tolerance and increased ADL ability.    Time  4    Period  Weeks    Status  New    Target Date  10/06/17        PT Long Term Goals - 09/08/17 1022      PT LONG TERM GOAL #1   Title  Patient will be independent in home exercise program to improve strength/mobility for better functional independence with ADLs.    Time  8    Period  Weeks    Status  New    Target Date  11/03/17      PT LONG TERM GOAL #2   Title  Patient (> 81 years old) will complete five times sit  to stand test in < 15 seconds indicating an increased LE strength and improved balance.    Time  8    Period  Weeks    Status  New    Target Date  11/03/17      PT LONG TERM GOAL #3   Title  Patient will increase six minute walk test distance to >1000 for progression to community ambulator and improve gait ability    Time  8    Period  Weeks    Status  New    Target Date  11/03/17      PT LONG TERM GOAL #4   Title  Patient will increase 10 meter walk test to >1.49m/s as to improve gait speed for better community ambulation and to reduce fall risk.    Time  8    Period  Weeks    Status  New    Target Date  11/03/17            Plan - 09/28/17 0945    Clinical Impression Statement  Patient instructed in intermediate strengthening and balance exercise.  Patient requires min Vcs for correct exercise technique including to improve LE control with standing exercise. Patient demonstrates better quad control with SLS tasks with rail assist. Patient would benefit from additional skilled PT intervention to improve balance/gait safety and reduce fall risk.    Rehab Potential  Good    Clinical Impairments Affecting Rehab Potential  hearing loss    PT Frequency  2x / week    PT Duration  8 weeks    PT Treatment/Interventions  Manual techniques;Balance training;Neuromuscular re-education;Functional mobility training;Therapeutic exercise;Therapeutic activities;Gait training    PT Next Visit Plan  static and dynamic standing balance training    Consulted and Agree with Plan of Care  Patient       Patient will benefit from skilled therapeutic intervention in order to improve the following deficits and impairments:  Abnormal gait, Decreased balance, Decreased endurance, Decreased mobility, Difficulty walking, Pain, Decreased activity tolerance, Decreased knowledge of use of DME, Decreased safety awareness, Decreased strength  Visit Diagnosis: Difficulty in walking, not elsewhere  classified  Muscle weakness (generalized)  Unsteadiness on feet     Problem List Patient Active Problem List   Diagnosis Date Noted  . Anxiety 08/07/2017  . Thyroid disease 08/06/2017  . Stress incontinence 08/06/2017  . Hypertension 08/06/2017  . Hypercholesterolemia 08/06/2017  . GERD without esophagitis 08/06/2017  . B12 deficiency 08/06/2017  . Vertigo 08/06/2017  . Complete tear of right rotator cuff 08/24/2015  . Abnormal glucose 03/27/2015  . Primary osteoarthritis of right knee 08/04/2014  . SOB (shortness of breath) 02/17/2014  . Vulvar itching 06/02/2012  . Perimenopausal atrophic vaginitis 06/02/2012    Ezekiel InaMansfield, Kristine S ,[place] DPT 09/28/2017, 9:50 AM  [facility] MAIN [facility] [address] , [place], [address] Phone: [phone]   Fax:  [phone]  Name: Cassandra Hahn MRN: [id number] Date of Birth: 11/01/1934

## 2017-09-30 ENCOUNTER — Ambulatory Visit: Payer: Medicare HMO | Admitting: Physical Therapy

## 2017-09-30 ENCOUNTER — Encounter: Payer: Self-pay | Admitting: Physical Therapy

## 2017-09-30 DIAGNOSIS — M6281 Muscle weakness (generalized): Secondary | ICD-10-CM | POA: Diagnosis not present

## 2017-09-30 DIAGNOSIS — R2681 Unsteadiness on feet: Secondary | ICD-10-CM | POA: Diagnosis not present

## 2017-09-30 DIAGNOSIS — R262 Difficulty in walking, not elsewhere classified: Secondary | ICD-10-CM | POA: Diagnosis not present

## 2017-09-30 NOTE — Therapy (Signed)
[place] [facility] MAIN [facility] SERVICES [address] [place], [place], [address] Phone: [phone]   Fax:  [phone]  Physical Therapy Treatment  Patient Details  Name: Cassandra Hahn MRN: [id number] Date of Birth: 1934-08-14 Referring Provider: [name] RENEE    Encounter Date: 09/30/2017  PT End of Session - 09/30/17 1023    Visit Number  5    Number of Visits  17    Date for PT Re-Evaluation  11/03/17    PT Start Time  1015    PT Stop Time  1100    PT Time Calculation (min)  45 min    Equipment Utilized During Treatment  Gait belt    Activity Tolerance  Patient tolerated treatment well;Patient limited by fatigue    Behavior During Therapy  [facility] for tasks assessed/performed       Past Medical History:  Diagnosis Date  . Abnormal glucose   . Allergy   . Anxiety   . Atrophic vaginitis   . Depression   . GERD (gastroesophageal reflux disease)   . Hyperlipidemia   . Hypertension   . Osteoarthritis   . Primary osteoarthritis of both knees   . SOB (shortness of breath)   . Stress incontinence, female   . Thyroid disease     Past Surgical History:  Procedure Laterality Date  . ABDOMINAL HYSTERECTOMY    . ROTATOR CUFF REPAIR Right 2009    There were no vitals filed for this visit.  Subjective Assessment - 09/30/17 1022    Subjective  Patient has poor balance. Her right knee is hurting 5/10.     Pertinent History  She reports that she is using the spc for 6 months and she has had one fall.     Patient Stated Goals  she wants her balance to get better.     Currently in Pain?  Yes    Pain Score  5     Pain Location  Knee    Pain Orientation  Right    Pain Descriptors / Indicators  Aching    Pain Type  Acute pain    Pain Onset  More than a month ago    Multiple Pain Sites  No          Therapeutic exercise: Squats x 15 with 5 sec hold  LAQ with 2 # BLE x 15 x 2 with 3 sec hold Marching with 2 # x 15 x 2 BLE Hip  extension standing with kneeextx 15 BLE Hip abd sidelying left and right x 15 BLE hooklying abd/ER x 15 x 2 with RTB Hooklying marching with 2 lbs x 15 x 2 Bridges x 10 Standing hip abdx 15 x 2BLE  Neuromuscular training: Foam flat side up and balance with head turns left and right feet apart and feet together,cues for better posture Side stepping  left and right x 10 lengths, cues for going slowly and she needs UE support with LOB backwards Modified tandem standing , cues for better posture  step ups from floor to 6 inch stool x 20 bilateral marching in parallel bars x 20, cues for bigger steps Stepping over bolster left and right and fwd/bwd, needs UE support with LOB backwards   mIn assist and  mod verbal cues used throughout with increased in postural sway and LOB most seen with narrow base of support and while on uneven surfaces. Continues to have balance deficits typical with diagnosis.  PT Education - 09/30/17 1022    Education Details  HEP    Person(s) Educated  Patient    Methods  Explanation;Demonstration;Verbal cues    Comprehension  Verbalized understanding;Returned demonstration       PT Short Term Goals - 09/08/17 1022      PT SHORT TERM GOAL #1   Title  Patient will increase BLE gross strength to 4+/5 as to improve functional strength for independent gait, increased standing tolerance and increased ADL ability.    Time  4    Period  Weeks    Status  New    Target Date  10/06/17        PT Long Term Goals - 09/08/17 1022      PT LONG TERM GOAL #1   Title  Patient will be independent in home exercise program to improve strength/mobility for better functional independence with ADLs.    Time  8    Period  Weeks    Status  New    Target Date  11/03/17      PT LONG TERM GOAL #2   Title  Patient (> 54 years old) will complete five times sit to stand test in < 15 seconds indicating an increased LE strength and improved  balance.    Time  8    Period  Weeks    Status  New    Target Date  11/03/17      PT LONG TERM GOAL #3   Title  Patient will increase six minute walk test distance to >1000 for progression to community ambulator and improve gait ability    Time  8    Period  Weeks    Status  New    Target Date  11/03/17      PT LONG TERM GOAL #4   Title  Patient will increase 10 meter walk test to >1.81m/s as to improve gait speed for better community ambulation and to reduce fall risk.    Time  8    Period  Weeks    Status  New    Target Date  11/03/17            Plan - 09/30/17 1024    Clinical Impression Statement  Pt was able to perform all exercises today with increased fatigue and right knee pain . Pt was able to perform all balance and strength exercises, demonstrating improvements in LE strength and stability.  Pt was able to complete dynamic balance exercises, showing ability to stand on uneven surfaces without LOB, min asssit and improve postural reactions to correct self during activities.  Pt requires verbal, visual and tactile cues during exercise in order to complete tasks with proper form and technique, as well as to stay on task.  Pt would continue to benefit from skilled PT services in order to further strengthen LE's, improve static and dynamic balance, and improve coordination in order to increase functional mobility and decrease risk of falls    Rehab Potential  Good    Clinical Impairments Affecting Rehab Potential  hearing loss    PT Frequency  2x / week    PT Duration  8 weeks    PT Treatment/Interventions  Manual techniques;Balance training;Neuromuscular re-education;Functional mobility training;Therapeutic exercise;Therapeutic activities;Gait training    PT Next Visit Plan  static and dynamic standing balance training    Consulted and Agree with Plan of Care  Patient       Patient will benefit from skilled therapeutic intervention in  order to improve the following  deficits and impairments:  Abnormal gait, Decreased balance, Decreased endurance, Decreased mobility, Difficulty walking, Pain, Decreased activity tolerance, Decreased knowledge of use of DME, Decreased safety awareness, Decreased strength  Visit Diagnosis: Difficulty in walking, not elsewhere classified  Muscle weakness (generalized)  Unsteadiness on feet     Problem List Patient Active Problem List   Diagnosis Date Noted  . Anxiety 08/07/2017  . Thyroid disease 08/06/2017  . Stress incontinence 08/06/2017  . Hypertension 08/06/2017  . Hypercholesterolemia 08/06/2017  . GERD without esophagitis 08/06/2017  . B12 deficiency 08/06/2017  . Vertigo 08/06/2017  . Complete tear of right rotator cuff 08/24/2015  . Abnormal glucose 03/27/2015  . Primary osteoarthritis of right knee 08/04/2014  . SOB (shortness of breath) 02/17/2014  . Vulvar itching 06/02/2012  . Perimenopausal atrophic vaginitis 06/02/2012    Ezekiel InaMansfield, Braylyn Eye S, [place] DPT 09/30/2017, 10:37 AM  [place] [facility] [address] [place], [place], [address] Phone: [phone]   Fax:  [phone]  Name: Cassandra Hahn MRN: [id number] Date of Birth: 22-Oct-1934

## 2017-10-01 ENCOUNTER — Encounter: Payer: Self-pay | Admitting: Nurse Practitioner

## 2017-10-01 ENCOUNTER — Ambulatory Visit (INDEPENDENT_AMBULATORY_CARE_PROVIDER_SITE_OTHER): Payer: Medicare HMO | Admitting: Nurse Practitioner

## 2017-10-01 ENCOUNTER — Other Ambulatory Visit: Payer: Self-pay

## 2017-10-01 VITALS — BP 144/62 | HR 65 | Temp 98.3°F | Ht 59.0 in | Wt 126.2 lb

## 2017-10-01 DIAGNOSIS — E860 Dehydration: Secondary | ICD-10-CM | POA: Diagnosis not present

## 2017-10-01 DIAGNOSIS — F419 Anxiety disorder, unspecified: Secondary | ICD-10-CM | POA: Diagnosis not present

## 2017-10-01 DIAGNOSIS — R410 Disorientation, unspecified: Secondary | ICD-10-CM | POA: Diagnosis not present

## 2017-10-01 DIAGNOSIS — R42 Dizziness and giddiness: Secondary | ICD-10-CM

## 2017-10-01 DIAGNOSIS — N3001 Acute cystitis with hematuria: Secondary | ICD-10-CM | POA: Diagnosis not present

## 2017-10-01 DIAGNOSIS — R269 Unspecified abnormalities of gait and mobility: Secondary | ICD-10-CM

## 2017-10-01 DIAGNOSIS — K5909 Other constipation: Secondary | ICD-10-CM

## 2017-10-01 DIAGNOSIS — R69 Illness, unspecified: Secondary | ICD-10-CM | POA: Diagnosis not present

## 2017-10-01 LAB — POCT URINALYSIS DIPSTICK
Bilirubin, UA: NEGATIVE
Glucose, UA: NEGATIVE
Ketones, UA: NEGATIVE
Nitrite, UA: NEGATIVE
Protein, UA: NEGATIVE
Spec Grav, UA: 1.025 (ref 1.010–1.025)
Urobilinogen, UA: 0.2 E.U./dL
pH, UA: 5 (ref 5.0–8.0)

## 2017-10-01 MED ORDER — CIPROFLOXACIN HCL 250 MG PO TABS: 250.0000 mg | ORAL_TABLET | Freq: Two times a day (BID) | ORAL | 0 refills | Status: AC

## 2017-10-01 MED ORDER — BUSPIRONE HCL 15 MG PO TABS: 15.0000 mg | ORAL_TABLET | Freq: Two times a day (BID) | ORAL | 5 refills | Status: DC

## 2017-10-01 MED ORDER — MECLIZINE HCL 12.5 MG PO TABS: 12.5000 mg | ORAL_TABLET | Freq: Three times a day (TID) | ORAL | 1 refills | Status: DC | PRN

## 2017-10-01 NOTE — Patient Instructions (Addendum)
Cassandra Hahn,   Thank you for coming in to clinic today.  You have a urinary tract infection or bladder infection.  1. Drink a whole pitcher of water (64-72 oz) every day this is eight 8oz glasses of water per day.  2. START ciprofloxacin 250 mg tablet twice daily for 7 days.  3. Continue meclizine as needed for dizziness.  Take up to three times daily as needed once you have finished your antibiotic.  4. Call clinic if your symptoms do not improve in the next 5-7 days.  Please schedule a follow-up appointment with Cassandra Hahn, AGNP. Return in 5-7 days if symptoms worsen or fail to improve.  If you have any other questions or concerns, please feel free to call the clinic or send a message through MyChart. You may also schedule an earlier appointment if necessary.  You will receive a survey after today's visit either digitally by e-mail or paper by [employer] mail. Your experiences and feedback matter to us.  Please respond so we know how we are doing as we provide care for you.   Cassandra McardleLauren Geanna Divirgilio, DNP, AGNP-BC Adult Gerontology Nurse Practitioner [facility], [facility]   Urinary Tract Infection, Adult A urinary tract infection (UTI) is an infection of any part of the urinary tract. The urinary tract includes the:  Kidneys.  Ureters.  Bladder.  Urethra.  These organs make, store, and get rid of pee (urine) in the body. Follow these instructions at home:  Take over-the-counter and prescription medicines only as told by your doctor.  If you were prescribed an antibiotic medicine, take it as told by your doctor. Do not stop taking the antibiotic even if you start to feel better.  Avoid the following drinks: ? Alcohol. ? Caffeine. ? Tea. ? Carbonated drinks.  Drink enough fluid to keep your pee clear or pale yellow.  Keep all follow-up visits as told by your doctor. This is important.  Make sure to: ? Empty your bladder often and completely. Do not to hold pee  for long periods of time. ? Empty your bladder before and after sex. ? Wipe from front to back after a bowel movement if you are female. Use each tissue one time when you wipe. Contact a doctor if:  You have back pain.  You have a fever.  You feel sick to your stomach (nauseous).  You throw up (vomit).  Your symptoms do not get better after 3 days.  Your symptoms go away and then come back. Get help right away if:  You have very bad back pain.  You have very bad lower belly (abdominal) pain.  You are throwing up and cannot keep down any medicines or water. This information is not intended to replace advice given to you by your health care provider. Make sure you discuss any questions you have with your health care provider. Document Released: 08/13/2007 Document Revised: 08/02/2015 Document Reviewed: 01/15/2015 Elsevier Interactive Patient Education  [employer]2018 [employer]

## 2017-10-01 NOTE — Progress Notes (Signed)
Subjective:    Patient ID: Cassandra Hahn, female    DOB: 09-28-1934, 83 y.o.   MRN: [id number]  Cassandra Hahn is a 83 y.o. female presenting on 10/01/2017 for Dizziness (intermittent dizziness x 4 days )   HPI Dizziness and Confusion A friend presented to office to schedule an appointment for Ms. Cassandra Hahn 3 days ago (09/28/2017) because of increased confusion.  Patient presents today and is unaccompanied. - Tells Kelita she has had dizziness x 4 days, tells me she has had this for 2 weeks. Also notes she is having problems with her balance, but is seeing [facility] outpatient rehab for PT as ordered after last visit.   - Patient denies back pain, congestion, cough, fever, headache, rhinitis, sorethroat, stomach ache and vaginal discharge. Patient does not have a history of recurrent UTI.  Patient does not have a history of pyelonephritis.  She is getting relief from vaginitis with estrace and steroid.   - Denies fever, chills, sweats, nausea, vomiting, diarrhea. - She endorses constipation over last 2 weeks.  She usually has BM every day, but now is every 2-3 days.   - She is drinking tea and diet coke throughout the day.  She does not drink much water.  Social History   Tobacco Use  . Smoking status: Never Smoker  . Smokeless tobacco: Never Used  Substance Use Topics  . Alcohol use: Never    Frequency: Never  . Drug use: Never    Review of Systems Per HPI unless specifically indicated above     Objective:    BP (!) 144/62 (BP Location: Right Arm, Patient Position: Sitting, Cuff Size: Normal)   Pulse 65   Temp 98.3 F (36.8 C) (Oral)   Ht 4\' 11"  (1.499 m)   Wt 126 lb 3.2 oz (57.2 kg)   BMI 25.49 kg/m   Wt Readings from Last 3 Encounters:  10/01/17 126 lb 3.2 oz (57.2 kg)  09/03/17 124 lb 6.4 oz (56.4 kg)  08/20/17 123 lb (55.8 kg)    Physical Exam  Constitutional: She is oriented to person, place, and time. She appears well-developed and well-nourished. No distress.  HENT:    Head: Normocephalic and atraumatic.  Cardiovascular: Normal rate, regular rhythm, S1 normal, S2 normal, normal heart sounds and intact distal pulses.  Pulmonary/Chest: Effort normal and breath sounds normal. No respiratory distress.  Abdominal: Soft. She exhibits distension (mildly distended). Bowel sounds are decreased. There is no hepatosplenomegaly. There is no tenderness. There is no rigidity, no rebound, no guarding, no CVA tenderness, no tenderness at McBurney's point and negative Murphy's sign. No hernia.  Neurological: She is alert and oriented to person, place, and time. Gait (continued wide stance gait, improved over last visit.  Pt using cane, but inappropriate use continues) abnormal.  Skin: Skin is warm and dry. Capillary refill takes less than 2 seconds.  Psychiatric: She has a normal mood and affect. Her speech is normal and behavior is normal. Thought content normal. Cognition and memory are impaired. She does not express impulsivity.  Vitals reviewed.    Results for orders placed or performed in visit on 10/01/17  POCT Urinalysis Dipstick  Result Value Ref Range   Color, UA yellow    Clarity, UA cloudy    Glucose, UA Negative Negative   Bilirubin, UA neg    Ketones, UA neg    Spec Grav, UA 1.025 1.010 - 1.025   Blood, UA trace    pH, UA 5.0 5.0 -  8.0   Protein, UA Negative Negative   Urobilinogen, UA 0.2 0.2 or 1.0 E.U./dL   Nitrite, UA negative    Leukocytes, UA Large (3+) (A) Negative   Appearance     Odor        Assessment & Plan:   Problem List Items Addressed This Visit      Other   Vertigo   Relevant Medications   meclizine (ANTIVERT) 12.5 MG tablet   Other Relevant Orders   Ambulatory referral to Home Health   Anxiety   Relevant Medications   busPIRone (BUSPAR) 15 MG tablet    Other Visit Diagnoses    Acute cystitis with hematuria    -  Primary   Relevant Medications   ciprofloxacin (CIPRO) 250 MG tablet   Other Relevant Orders   POCT  Urinalysis Dipstick (Completed)   Urine Culture   Other constipation       Dehydration, mild       Gait difficulty       Relevant Orders   Ambulatory referral to Home Health   Confusion       Relevant Orders   Ambulatory referral to Home Health      Acute cystitis with hematuria.  Pt symptomatic currently with increased confusion, dizziness, and gait instablity. Currently without systemic signs or symptoms of infection.   - No current risk of concurrent STI. - Complicated by dehydration, constipation.  Plan: 1. START ciprofloxacin 250 mg bid x 7 days.   - Send Urine culture 2. Provided non-pharm measures for UTI prevention for good hygiene. 3. Drink plenty of fluids and improve hydration over next 1 week.  Encouraged at least 64 oz water intake per day. 4. Provided precautions for severe symptoms requiring ED visit to include no urine in 24-48 hours. 5. Followup 2-5 days as needed for worsening or persistent symptoms.    Meds ordered this encounter  Medications  . ciprofloxacin (CIPRO) 250 MG tablet    Sig: Take 1 tablet (250 mg total) by mouth 2 (two) times daily for 7 days.    Dispense:  14 tablet    Refill:  0    Order Specific Question:   Supervising Provider    Answer:   Smitty CordsKARAMALEGOS, ALEXANDER J [[id number]]  . meclizine (ANTIVERT) 12.5 MG tablet    Sig: Take 1 tablet (12.5 mg total) by mouth 3 (three) times daily as needed for dizziness.    Dispense:  30 tablet    Refill:  1    Order Specific Question:   Supervising Provider    Answer:   Smitty CordsKARAMALEGOS, ALEXANDER J [[id number]]  . busPIRone (BUSPAR) 15 MG tablet    Sig: Take 1 tablet (15 mg total) by mouth 2 (two) times daily.    Dispense:  60 tablet    Refill:  5    Order Specific Question:   Supervising Provider    Answer:   Smitty CordsKARAMALEGOS, ALEXANDER J [[id number]]    Discussed patient encounter with her son.  He is also noting patient is having difficulty driving to her physical therapy appointments.  She has a friend who has taken  her several times, but the friend cannot continue taking Ms. Cassandra Hahn.  Ms. Cassandra DakinRiley is not able to drive herself outside of the [place] area without getting lost or confused.  Offered HH PT. Patient's son requests continuing with this plan as compared to outpatient PT.  Follow up plan: Return in 5-7 days if symptoms worsen or fail to improve.  Wilhelmina McardleLauren Darrian Grzelak,  DNP, AGPCNP-BC Adult Gerontology Primary Care Nurse Practitioner [facility] 10/01/2017, 12:54 PM

## 2017-10-02 LAB — URINE CULTURE
MICRO NUMBER:: 90882581
SPECIMEN QUALITY:: ADEQUATE

## 2017-10-05 ENCOUNTER — Telehealth: Payer: Self-pay | Admitting: Nurse Practitioner

## 2017-10-05 ENCOUNTER — Ambulatory Visit: Payer: Medicare HMO | Admitting: Physical Therapy

## 2017-10-05 DIAGNOSIS — R69 Illness, unspecified: Secondary | ICD-10-CM | POA: Diagnosis not present

## 2017-10-05 DIAGNOSIS — K5909 Other constipation: Secondary | ICD-10-CM | POA: Diagnosis not present

## 2017-10-05 DIAGNOSIS — R2689 Other abnormalities of gait and mobility: Secondary | ICD-10-CM | POA: Diagnosis not present

## 2017-10-05 DIAGNOSIS — N3000 Acute cystitis without hematuria: Secondary | ICD-10-CM | POA: Diagnosis not present

## 2017-10-05 DIAGNOSIS — M170 Bilateral primary osteoarthritis of knee: Secondary | ICD-10-CM | POA: Diagnosis not present

## 2017-10-05 DIAGNOSIS — H9193 Unspecified hearing loss, bilateral: Secondary | ICD-10-CM | POA: Diagnosis not present

## 2017-10-05 DIAGNOSIS — N3001 Acute cystitis with hematuria: Secondary | ICD-10-CM | POA: Diagnosis not present

## 2017-10-05 DIAGNOSIS — R42 Dizziness and giddiness: Secondary | ICD-10-CM | POA: Diagnosis not present

## 2017-10-05 DIAGNOSIS — R531 Weakness: Secondary | ICD-10-CM | POA: Diagnosis not present

## 2017-10-05 NOTE — Telephone Encounter (Signed)
The patient called back to inform us that she found her Cipro pills.

## 2017-10-05 NOTE — Telephone Encounter (Signed)
Lem with home health PT saw pt today and notice pt did not have cipro.  Pharmacy said she picked it up but pt said she had not been taking it.  Please call Lem [phone].  Pt's number is [phone]

## 2017-10-06 ENCOUNTER — Telehealth: Payer: Self-pay | Admitting: Nurse Practitioner

## 2017-10-06 NOTE — Telephone Encounter (Signed)
Called and provided verbal order for bi-weekly for 2 weeks and weekly for 2 weeks.

## 2017-10-06 NOTE — Telephone Encounter (Signed)
Lem needs a verbal for PT twice a week for 2 weeks and then once a week for 2 weeks [phone]

## 2017-10-07 ENCOUNTER — Ambulatory Visit (INDEPENDENT_AMBULATORY_CARE_PROVIDER_SITE_OTHER): Payer: Medicare HMO | Admitting: Nurse Practitioner

## 2017-10-07 ENCOUNTER — Encounter: Payer: Self-pay | Admitting: Nurse Practitioner

## 2017-10-07 ENCOUNTER — Other Ambulatory Visit: Payer: Self-pay

## 2017-10-07 ENCOUNTER — Ambulatory Visit: Payer: Medicare HMO | Admitting: Physical Therapy

## 2017-10-07 VITALS — BP 130/57 | HR 70 | Temp 98.1°F | Ht 59.0 in | Wt 126.4 lb

## 2017-10-07 DIAGNOSIS — E079 Disorder of thyroid, unspecified: Secondary | ICD-10-CM

## 2017-10-07 DIAGNOSIS — T50901A Poisoning by unspecified drugs, medicaments and biological substances, accidental (unintentional), initial encounter: Secondary | ICD-10-CM

## 2017-10-07 DIAGNOSIS — R413 Other amnesia: Secondary | ICD-10-CM

## 2017-10-07 DIAGNOSIS — R42 Dizziness and giddiness: Secondary | ICD-10-CM

## 2017-10-07 DIAGNOSIS — F0390 Unspecified dementia without behavioral disturbance: Secondary | ICD-10-CM | POA: Diagnosis not present

## 2017-10-07 DIAGNOSIS — R69 Illness, unspecified: Secondary | ICD-10-CM | POA: Diagnosis not present

## 2017-10-07 MED ORDER — MECLIZINE HCL 12.5 MG PO TABS: 6.2500 mg | ORAL_TABLET | Freq: Three times a day (TID) | ORAL | 1 refills | Status: DC | PRN

## 2017-10-07 MED ORDER — DONEPEZIL HCL 5 MG PO TABS: 5.0000 mg | ORAL_TABLET | Freq: Every day | ORAL | 1 refills | Status: DC

## 2017-10-07 NOTE — Patient Instructions (Addendum)
Williemae NatterPeggy F Lucken,   Thank you for coming in to clinic today.  1. Use a pill box every day and every week.    REDUCE meclizine to 1/2 tablet three times daily as needed.  2. For memory, we are starting donepezil 5 mg once daily.   Please schedule a follow-up appointment with Wilhelmina McardleLauren Deshawn Witty, AGNP. Return if symptoms worsen or fail to improve  AND in August .  If you have any other questions or concerns, please feel free to call the clinic or send a message through MyChart. You may also schedule an earlier appointment if necessary.  You will receive a survey after today's visit either digitally by e-mail or paper by [employer] mail. Your experiences and feedback matter to us.  Please respond so we know how we are doing as we provide care for you.   Wilhelmina McardleLauren Heather Streeper, DNP, AGNP-BC Adult Gerontology Nurse Practitioner [facility], [facility]

## 2017-10-07 NOTE — Progress Notes (Signed)
Subjective:    Patient ID: Cassandra Hahn, female    DOB: [birth year], 83 y.o.   MRN: [id number]  Cassandra Hahn is a 83 y.o. female presenting on 10/07/2017 for Altered Mental Status (dizziness x 2-3 days )   HPI Confusion and dizziness Patient continues to be concerned for "inner ear" problems as cause of her dizziness.  She has had long-term use of meclizine for this.  Patient is very poor historian.  Is very defensive about her medications today.  Patient's friend continues to call clinic concerned for patient's memory/confusion after last visit.  No information has been shared with her to date and her friend is not accompanying patient today.  Medication mismanagement She had a refill on 10/01/2017 of #30 tabs meclizine.  She has taken 18 tabs to date by pill count, which is approximately 2 tabs too many.  She also has too few ciprofloxacin to treat UTI (presumed and empiric therapy).  She lost her prescription for 24 hours after getting it filled, so she should still have at least 2 tabs remaining.  Pill counts of levothyroxine shows only 30 pills remaining.  She had this filled on 08/10/2017 #90 and should have 33 remaining.  Lem, PT with [facility] expressed concern about her medications after his home visit.  MMSE - Mini Mental State Exam 10/07/2017  Orientation to time 0  Orientation to Place 3  Registration 0  Attention/ Calculation 0  Recall 0  Language- name 2 objects 2  Language- repeat 0  Language- follow 3 step command 0  Language- read & follow direction 1  Write a sentence 1  Copy design 0  Total score 7    Social History   Tobacco Use  . Smoking status: Never Smoker  . Smokeless tobacco: Never Used  Substance Use Topics  . Alcohol use: Never    Frequency: Never  . Drug use: Never    Review of Systems Per HPI unless specifically indicated above     Objective:    BP (!) 130/57 (BP Location: Right Arm, Patient Position: Sitting, Cuff Size: Normal)   Pulse 70    Temp 98.1 F (36.7 C) (Oral)   Ht 4\' 11"  (1.499 m)   Wt 126 lb 6.4 oz (57.3 kg)   BMI 25.53 kg/m   Wt Readings from Last 3 Encounters:  10/07/17 126 lb 6.4 oz (57.3 kg)  10/01/17 126 lb 3.2 oz (57.2 kg)  09/03/17 124 lb 6.4 oz (56.4 kg)    Physical Exam  Constitutional: She appears well-developed and well-nourished. No distress.  HENT:  Head: Normocephalic and atraumatic.  Cardiovascular: Normal rate, regular rhythm, S1 normal, S2 normal, normal heart sounds and intact distal pulses.  Pulmonary/Chest: Effort normal and breath sounds normal. No respiratory distress.  Neurological: She is alert. She has normal strength and normal reflexes. She is disoriented (date, situation). No cranial nerve deficit or sensory deficit. She displays a negative Romberg sign. Gait (weakness, antalgic gait.  Using cane, improved over 3 weeks ago, no wobbling or weaving side to side) abnormal. GCS eye subscore is 4. GCS verbal subscore is 5. GCS motor subscore is 6.  Negative dix-hallpike, negative forward head tilt.    Skin: Skin is warm and dry.  Psychiatric: She has a normal mood and affect. Her speech is normal and behavior is normal. Thought content normal. Cognition and memory are impaired. She expresses inappropriate judgment. She does not express impulsivity. She exhibits abnormal recent memory.  Cognitive impairment: reduced  short term memory, agitation with changes to medication doses, defensive when asking if she has taken too many doses of medications   Vitals reviewed.    Results for orders placed or performed in visit on 10/01/17  Urine Culture  Result Value Ref Range   MICRO NUMBER: [id number]    SPECIMEN QUALITY: ADEQUATE    Sample Source URINE    STATUS: FINAL    ISOLATE 1:      Single organism less than 10,000 CFU/mL isolated. These organisms, commonly found on external and internal genitalia, are considered colonizers. No further testing performed.  POCT Urinalysis Dipstick  Result  Value Ref Range   Color, UA yellow    Clarity, UA cloudy    Glucose, UA Negative Negative   Bilirubin, UA neg    Ketones, UA neg    Spec Grav, UA 1.025 1.010 - 1.025   Blood, UA trace    pH, UA 5.0 5.0 - 8.0   Protein, UA Negative Negative   Urobilinogen, UA 0.2 0.2 or 1.0 E.U./dL   Nitrite, UA negative    Leukocytes, UA Large (3+) (A) Negative   Appearance     Odor        Assessment & Plan:   Problem List Items Addressed This Visit      Endocrine   Thyroid disease, hypothyroidism Patient with known hypothyroidism with last check 3 months ago in normal limits.  Recheck today.  Assess levothyroxine dosing.  Followup prn.   Relevant Orders   TSH    Other Visit Diagnoses    Dizziness    -  Primary Ongoing symptomatology for patient without worsening.  No improvement with meclizine per patient report.    Plan: 1. Suspect patient has had moments when she forgot she took her medications and is taking too much medication.  Buspirone is often associated with dizziness, however patient has taken this medication for many years at stable dose without continuous dizziness.  If she is taking too many tabs, this could increase risk of dizziness. 2. High rate of meclizine usage since last fill, also could worsen dizziness.  Reduce dose to 1/2 tab tid.  Consider completely stopping med.   3. Add medication management to home health with [facility].  Use pill box or pill dispenser. 4. Labs today 5. Followup 4 weeks.   Relevant Orders   CBC with Differential/Platelet   COMPLETE METABOLIC PANEL WITH GFR   TSH   Dementia without behavioral disturbance, unspecified dementia type     Patient with worsening memory and difficulty with self care.  Discussed with patient's son after last visit.  Patient determined to need additional assistance with driving for long distances.  Son is not local to patient for day-to-day assistance.  MMSE today: 7  Plan: 1. START donepezil 5 mg once daily for memory  preservation 2. Start HH as above 3. Request driving evaluation, community navigation for iADLs to grocery store/pharmacy. 4. Followup 4 weeks as scheduled.   Relevant Medications   donepezil (ARICEPT) 5 MG tablet   Other Relevant Orders   Ambulatory referral to Home Health   Memory loss       Relevant Medications   donepezil (ARICEPT) 5 MG tablet   Other Relevant Orders   Ambulatory referral to Home Health   Medication administered in error, accidental or unintentional, initial encounter     See  AP dizziness above.      Meds ordered this encounter  Medications  . donepezil (ARICEPT) 5 MG  tablet    Sig: Take 1 tablet (5 mg total) by mouth at bedtime.    Dispense:  90 tablet    Refill:  1    Order Specific Question:   Supervising Provider    Answer:   Smitty Cords [[id number]]  . meclizine (ANTIVERT) 12.5 MG tablet    Sig: Take 0.5 tablets (6.25 mg total) by mouth 3 (three) times daily as needed for dizziness.    Dispense:  30 tablet    Refill:  1    Order Specific Question:   Supervising Provider    Answer:   Smitty Cords [[id number]]    Follow up plan: Return if symptoms worsen or fail to improve  AND in August .  Cassandra Mcardle, DNP, AGPCNP-BC Adult Gerontology Primary Care Nurse Practitioner [facility] 10/07/2017, 3:18 PM

## 2017-10-08 LAB — CBC WITH DIFFERENTIAL/PLATELET
Basophils Absolute: 33 cells/uL (ref 0–200)
Basophils Relative: 0.4 %
Eosinophils Absolute: 17 cells/uL (ref 15–500)
Eosinophils Relative: 0.2 %
HCT: 37.4 % (ref 35.0–45.0)
Hemoglobin: 12.4 g/dL (ref 11.7–15.5)
Lymphs Abs: 1436 cells/uL (ref 850–3900)
MCH: 30 pg (ref 27.0–33.0)
MCHC: 33.2 g/dL (ref 32.0–36.0)
MCV: 90.3 fL (ref 80.0–100.0)
MPV: 10.6 fL (ref 7.5–12.5)
Monocytes Relative: 8 %
Neutro Abs: 6150 cells/uL (ref 1500–7800)
Neutrophils Relative %: 74.1 %
Platelets: 250 10*3/uL (ref 140–400)
RBC: 4.14 10*6/uL (ref 3.80–5.10)
RDW: 12.7 % (ref 11.0–15.0)
Total Lymphocyte: 17.3 %
WBC mixed population: 664 cells/uL (ref 200–950)
WBC: 8.3 10*3/uL (ref 3.8–10.8)

## 2017-10-08 LAB — COMPLETE METABOLIC PANEL WITH GFR
AG Ratio: 1.6 (calc) (ref 1.0–2.5)
ALT: 9 U/L (ref 6–29)
AST: 17 U/L (ref 10–35)
Albumin: 4.2 g/dL (ref 3.6–5.1)
Alkaline phosphatase (APISO): 83 U/L (ref 33–130)
BUN: 16 mg/dL (ref 7–25)
CO2: 27 mmol/L (ref 20–32)
Calcium: 9.5 mg/dL (ref 8.6–10.4)
Chloride: 102 mmol/L (ref 98–110)
Creat: 0.81 mg/dL (ref 0.60–0.88)
GFR, Est African American: 79 mL/min/{1.73_m2} (ref >=60)
GFR, Est Non African American: 69 mL/min/{1.73_m2} (ref >=60)
Globulin: 2.7 g/dL (calc) (ref 1.9–3.7)
Glucose, Bld: 85 mg/dL (ref 65–99)
Potassium: 4.2 mmol/L (ref 3.5–5.3)
Sodium: 138 mmol/L (ref 135–146)
Total Bilirubin: 0.5 mg/dL (ref 0.2–1.2)
Total Protein: 6.9 g/dL (ref 6.1–8.1)

## 2017-10-08 LAB — TSH: TSH: 1.89 mIU/L (ref 0.40–4.50)

## 2017-10-09 ENCOUNTER — Telehealth: Payer: Self-pay | Admitting: Nurse Practitioner

## 2017-10-09 NOTE — Telephone Encounter (Signed)
Lemuel with [facility] called   [phone]......Marland Kitchen.States that pt son said that pt did not sleep well last knight states that her stomach was upset due to need medication, states that he called her she did not answer her phone, so this will be a miss appt.

## 2017-10-09 NOTE — Telephone Encounter (Signed)
Noted.  Will try again for next visit.

## 2017-10-12 ENCOUNTER — Ambulatory Visit: Payer: Medicare HMO | Admitting: Physical Therapy

## 2017-10-12 DIAGNOSIS — H9193 Unspecified hearing loss, bilateral: Secondary | ICD-10-CM | POA: Diagnosis not present

## 2017-10-12 DIAGNOSIS — N3000 Acute cystitis without hematuria: Secondary | ICD-10-CM | POA: Diagnosis not present

## 2017-10-12 DIAGNOSIS — M170 Bilateral primary osteoarthritis of knee: Secondary | ICD-10-CM | POA: Diagnosis not present

## 2017-10-12 DIAGNOSIS — K5909 Other constipation: Secondary | ICD-10-CM | POA: Diagnosis not present

## 2017-10-12 DIAGNOSIS — R69 Illness, unspecified: Secondary | ICD-10-CM | POA: Diagnosis not present

## 2017-10-14 ENCOUNTER — Ambulatory Visit: Payer: Medicare HMO | Admitting: Physical Therapy

## 2017-10-14 ENCOUNTER — Telehealth: Payer: Self-pay

## 2017-10-14 DIAGNOSIS — K5909 Other constipation: Secondary | ICD-10-CM | POA: Diagnosis not present

## 2017-10-14 DIAGNOSIS — M170 Bilateral primary osteoarthritis of knee: Secondary | ICD-10-CM | POA: Diagnosis not present

## 2017-10-14 DIAGNOSIS — N3000 Acute cystitis without hematuria: Secondary | ICD-10-CM | POA: Diagnosis not present

## 2017-10-14 DIAGNOSIS — R69 Illness, unspecified: Secondary | ICD-10-CM | POA: Diagnosis not present

## 2017-10-14 DIAGNOSIS — H9193 Unspecified hearing loss, bilateral: Secondary | ICD-10-CM | POA: Diagnosis not present

## 2017-10-14 NOTE — Telephone Encounter (Signed)
The pt son was notified of Lauren recommendations. He stated he would call and let her know the recommendations.

## 2017-10-14 NOTE — Telephone Encounter (Signed)
Clayton LefortLemuel called from [facility] stating that the patient is complaining that the Buspirone is worsening her dizziness spells and making her feel sick. He is requesting that we call the patient and notify her of recommendations.

## 2017-10-14 NOTE — Telephone Encounter (Signed)
For Buspirone:  Patient may take one tab once daily at bedtime for 1 week.  Then, take every other day at bedtime for 1 week.  Then, may stop.

## 2017-10-17 DIAGNOSIS — M1711 Unilateral primary osteoarthritis, right knee: Secondary | ICD-10-CM | POA: Diagnosis not present

## 2017-10-17 DIAGNOSIS — M25461 Effusion, right knee: Secondary | ICD-10-CM | POA: Diagnosis not present

## 2017-10-18 ENCOUNTER — Other Ambulatory Visit: Payer: Self-pay | Admitting: Nurse Practitioner

## 2017-10-18 DIAGNOSIS — E039 Hypothyroidism, unspecified: Secondary | ICD-10-CM

## 2017-10-19 ENCOUNTER — Encounter: Payer: Medicare HMO | Admitting: Physical Therapy

## 2017-10-19 ENCOUNTER — Telehealth: Payer: Self-pay

## 2017-10-19 NOTE — Telephone Encounter (Signed)
The pt son called to update you on incident that happen Friday, August 9th.  The pt son stated that he received a phone call from one of the neighbor stating that his mother could not walk. EMS was called and patient was evaluated and determine it was not an emergency. The pt was seen at acute care/ Orthopedics and given a cortisone injection for the knee pain. She currently got a follow-up appointment schedule in September for the knee pain.

## 2017-10-21 ENCOUNTER — Telehealth: Payer: Self-pay | Admitting: Nurse Practitioner

## 2017-10-21 ENCOUNTER — Encounter: Payer: Medicare HMO | Admitting: Physical Therapy

## 2017-10-21 DIAGNOSIS — H9193 Unspecified hearing loss, bilateral: Secondary | ICD-10-CM | POA: Diagnosis not present

## 2017-10-21 DIAGNOSIS — R69 Illness, unspecified: Secondary | ICD-10-CM | POA: Diagnosis not present

## 2017-10-21 DIAGNOSIS — N3000 Acute cystitis without hematuria: Secondary | ICD-10-CM | POA: Diagnosis not present

## 2017-10-21 DIAGNOSIS — M170 Bilateral primary osteoarthritis of knee: Secondary | ICD-10-CM | POA: Diagnosis not present

## 2017-10-21 DIAGNOSIS — K5909 Other constipation: Secondary | ICD-10-CM | POA: Diagnosis not present

## 2017-10-21 NOTE — Telephone Encounter (Signed)
All placed to patient's son Cassandra Hahn.  Follow-up on weekend phone call about patient having difficulty with ambulation and not feeling well.  No problems with medications.  Patient was seen by [facility] urgent care and was provided a cortisone shot to her knee that was causing her pain and mobility problems.  Cassandra Hahn reports his mother is now near normal and at baseline.  Medications also have been evaluated by family.  System now set up where patient has medications more easily accessible and to take correctly.

## 2017-10-28 DIAGNOSIS — K5909 Other constipation: Secondary | ICD-10-CM | POA: Diagnosis not present

## 2017-10-28 DIAGNOSIS — R69 Illness, unspecified: Secondary | ICD-10-CM | POA: Diagnosis not present

## 2017-10-28 DIAGNOSIS — N3000 Acute cystitis without hematuria: Secondary | ICD-10-CM | POA: Diagnosis not present

## 2017-10-28 DIAGNOSIS — H9193 Unspecified hearing loss, bilateral: Secondary | ICD-10-CM | POA: Diagnosis not present

## 2017-10-28 DIAGNOSIS — M170 Bilateral primary osteoarthritis of knee: Secondary | ICD-10-CM | POA: Diagnosis not present

## 2017-11-02 ENCOUNTER — Telehealth: Payer: Self-pay | Admitting: Nurse Practitioner

## 2017-11-02 NOTE — Telephone Encounter (Signed)
May continue PT once weekly for 3 weeks to work toward meeting goals.  Verbal order provided to Lem.

## 2017-11-02 NOTE — Telephone Encounter (Signed)
Lem with [facility] said his last visit with pt was last week but she has  Not met her goals yet.  He is requesting additional home health physical therapy for once a week for 3 weeks [phone]

## 2017-11-04 DIAGNOSIS — H9193 Unspecified hearing loss, bilateral: Secondary | ICD-10-CM | POA: Diagnosis not present

## 2017-11-04 DIAGNOSIS — M170 Bilateral primary osteoarthritis of knee: Secondary | ICD-10-CM | POA: Diagnosis not present

## 2017-11-04 DIAGNOSIS — K5909 Other constipation: Secondary | ICD-10-CM | POA: Diagnosis not present

## 2017-11-04 DIAGNOSIS — N3000 Acute cystitis without hematuria: Secondary | ICD-10-CM | POA: Diagnosis not present

## 2017-11-04 DIAGNOSIS — R69 Illness, unspecified: Secondary | ICD-10-CM | POA: Diagnosis not present

## 2017-11-06 ENCOUNTER — Encounter: Payer: Self-pay | Admitting: Nurse Practitioner

## 2017-11-06 ENCOUNTER — Ambulatory Visit (INDEPENDENT_AMBULATORY_CARE_PROVIDER_SITE_OTHER): Payer: Medicare HMO | Admitting: Nurse Practitioner

## 2017-11-06 ENCOUNTER — Other Ambulatory Visit: Payer: Self-pay

## 2017-11-06 VITALS — BP 127/50 | HR 65 | Temp 98.2°F | Ht 59.0 in | Wt 123.0 lb

## 2017-11-06 DIAGNOSIS — F0280 Dementia in other diseases classified elsewhere without behavioral disturbance: Secondary | ICD-10-CM

## 2017-11-06 DIAGNOSIS — G301 Alzheimer's disease with late onset: Secondary | ICD-10-CM | POA: Diagnosis not present

## 2017-11-06 DIAGNOSIS — K219 Gastro-esophageal reflux disease without esophagitis: Secondary | ICD-10-CM | POA: Diagnosis not present

## 2017-11-06 DIAGNOSIS — R69 Illness, unspecified: Secondary | ICD-10-CM | POA: Diagnosis not present

## 2017-11-06 DIAGNOSIS — M1711 Unilateral primary osteoarthritis, right knee: Secondary | ICD-10-CM | POA: Diagnosis not present

## 2017-11-06 DIAGNOSIS — F419 Anxiety disorder, unspecified: Secondary | ICD-10-CM

## 2017-11-06 MED ORDER — BUSPIRONE HCL 15 MG PO TABS: 7.5000 mg | ORAL_TABLET | Freq: Two times a day (BID) | ORAL | 5 refills | Status: DC

## 2017-11-06 MED ORDER — BUSPIRONE HCL 7.5 MG PO TABS: 7.5000 mg | ORAL_TABLET | Freq: Two times a day (BID) | ORAL | 11 refills | Status: AC

## 2017-11-06 NOTE — Patient Instructions (Addendum)
Cassandra Hahn,   Thank you for coming in to clinic today.  1. For your worrying medicine - you need to take this twice daily. - Take buspirone 7.5 mg once whole tablet twice daily.  2. Continue your remembering medicine Take donepezil 5 mg once daily.  3. No other medication changes today.  4. Use your walker every time your are walking.  5. Only go places when you have the help you need to get into the building.  This will probably mean someone needs to take you everywhere you go.  Please schedule a follow-up appointment with Wilhelmina McardleLauren Aerica Rincon, AGNP. Return in about 3 months (around 02/06/2018) for dementia.  If you have any other questions or concerns, please feel free to call the clinic or send a message through MyChart. You may also schedule an earlier appointment if necessary.  You will receive a survey after today's visit either digitally by e-mail or paper by [employer] mail. Your experiences and feedback matter to us.  Please respond so we know how we are doing as we provide care for you.   Wilhelmina McardleLauren Mylei Brackeen, DNP, AGNP-BC Adult Gerontology Nurse Practitioner [facility], [facility]

## 2017-11-06 NOTE — Progress Notes (Signed)
Subjective:    Patient ID: Cassandra Hahn, female    DOB: 1935-02-28, 83 y.o.   MRN: [id number]  Cassandra Nattereggy F Arts is a 83 y.o. female presenting on 11/06/2017 for Thyroid Problem and Knee Injury (pt was seen by orthopedic for Rt knee pain, PT x 2 week, f/u with ortho Nov. 13th.)   HPI  Patient presents today for regular followup of chronic conditions and is accompanied by her daughter-in-law Erskine SquibbJane.   Anxiety Patient has had a fall about 2 months ago with left sided rib/flank pain that patient is attributing to her buspirone.  She is only taking 1/2 tab (7.5 mg total) once daily at bedtime.  Daughter-in-law states that patient is picking at/biting nails again.  Patient's mother also had severe anxiety. Medication at that time caused patient's mother to be absent.  Patient continues to deny she has anxiety.    Dementia Patient's family is working to establish systems for patient to continue to be independent in her home.  She still has her car keys and drives occasionally independently, however, has gotten lost when traveling outside of [place].  [facility], [employer] occasionally.  Patient is now having difficulty with stability walking 2/2 R knee pain weakness.  She is still in denial about memory problems and adamantly describes she is able to drive, take care of everything herself.  Started donepezil 5 mg after last visit.  Patient is tolerating well without any new side effects.  GERD Patient states she has regular BM every morning.  Is having hearburn in past, controlled on pantoprazole 40 mg once daily.  She denies any blood in stool, nausea, vomiting or bleeding.  R knee pain Patient regularly has steroid injections for R knee and is currently managed by [facility].  She is undergoing HH PT for R knee pain  Has appointment on 8/10 with [facility] for re-evaluation at orthopedics and for steroid injection.   Is currently wearing knee brace to support joint and  prevent falling.  She has not been able to learn to use her cane well.  Is now using walker 100%, but cannot lift a walker into and out of her car.  Social History   Tobacco Use  . Smoking status: Never Smoker  . Smokeless tobacco: Never Used  Substance Use Topics  . Alcohol use: Never    Frequency: Never  . Drug use: Never    Review of Systems Per HPI unless specifically indicated above     Objective:    BP (!) 127/50 (BP Location: Right Arm, Patient Position: Sitting, Cuff Size: Normal)   Pulse 65   Temp 98.2 F (36.8 C) (Oral)   Ht 4\' 11"  (1.499 m)   Wt 123 lb (55.8 kg)   BMI 24.84 kg/m   Wt Readings from Last 3 Encounters:  11/06/17 123 lb (55.8 kg)  10/07/17 126 lb 6.4 oz (57.3 kg)  10/01/17 126 lb 3.2 oz (57.2 kg)    Physical Exam  Constitutional: She is oriented to person, place, and time. She appears well-developed and well-nourished. No distress.  HENT:  Head: Normocephalic and atraumatic.  Cardiovascular: Normal rate, regular rhythm, S1 normal, S2 normal, normal heart sounds and intact distal pulses.  Pulmonary/Chest: Effort normal and breath sounds normal. No respiratory distress.  Abdominal: Soft. Normal appearance and bowel sounds are normal. There is no tenderness.  Musculoskeletal:       Right knee: She exhibits decreased range of motion,  swelling and effusion. She exhibits no ecchymosis, no deformity, no laceration, no erythema, normal alignment, no LCL laxity, normal patellar mobility, no bony tenderness, normal meniscus and no MCL laxity. Tenderness found. Medial joint line and lateral joint line tenderness noted.  Neurological: She is alert and oriented to person, place, and time.  Skin: Skin is warm and dry. Capillary refill takes less than 2 seconds.  Psychiatric: She has a normal mood and affect. Her speech is normal and behavior is normal. Judgment and thought content normal. Cognition and memory are impaired. She exhibits abnormal recent memory.    Patient is oriented to time, place, situation, person.  Patient is unable to state president name, learn name and repeat later in conversation.  Has difficulty remembering medication instructions 30-60 seconds after being told and repeating back.   Vitals reviewed.   Results for orders placed or performed in visit on 10/07/17  CBC with Differential/Platelet  Result Value Ref Range   WBC 8.3 3.8 - 10.8 Thousand/uL   RBC 4.14 3.80 - 5.10 Million/uL   Hemoglobin 12.4 11.7 - 15.5 g/dL   HCT 16.1 09.6 - 04.5 %   MCV 90.3 80.0 - 100.0 fL   MCH 30.0 27.0 - 33.0 pg   MCHC 33.2 32.0 - 36.0 g/dL   RDW 40.9 81.1 - 91.4 %   Platelets 250 140 - 400 Thousand/uL   MPV 10.6 7.5 - 12.5 fL   Neutro Abs 6,150 1,500 - 7,800 cells/uL   Lymphs Abs 1,436 850 - 3,900 cells/uL   WBC mixed population 664 200 - 950 cells/uL   Eosinophils Absolute 17 15 - 500 cells/uL   Basophils Absolute 33 0 - 200 cells/uL   Neutrophils Relative % 74.1 %   Total Lymphocyte 17.3 %   Monocytes Relative 8.0 %   Eosinophils Relative 0.2 %   Basophils Relative 0.4 %  TSH  Result Value Ref Range   TSH 1.89 0.40 - 4.50 mIU/L  COMPLETE METABOLIC PANEL WITH GFR  Result Value Ref Range   Glucose, Bld 85 65 - 99 mg/dL   BUN 16 7 - 25 mg/dL   Creat 7.82 9.56 - 2.13 mg/dL   GFR, Est Non African American 69 > OR = 60 mL/min/1.47m2   GFR, Est African American 79 > OR = 60 mL/min/1.67m2   BUN/Creatinine Ratio NOT APPLICABLE 6 - 22 (calc)   Sodium 138 135 - 146 mmol/L   Potassium 4.2 3.5 - 5.3 mmol/L   Chloride 102 98 - 110 mmol/L   CO2 27 20 - 32 mmol/L   Calcium 9.5 8.6 - 10.4 mg/dL   Total Protein 6.9 6.1 - 8.1 g/dL   Albumin 4.2 3.6 - 5.1 g/dL   Globulin 2.7 1.9 - 3.7 g/dL (calc)   AG Ratio 1.6 1.0 - 2.5 (calc)   Total Bilirubin 0.5 0.2 - 1.2 mg/dL   Alkaline phosphatase (APISO) 83 33 - 130 U/L   AST 17 10 - 35 U/L   ALT 9 6 - 29 U/L      Assessment & Plan:   Problem List Items Addressed This Visit      Digestive    GERD without esophagitis - Primary Stable today on exam.  Medications tolerated without side effects.  Continue at current doses.  Refills provided.   . Followup 3 months     Nervous and Auditory   Dementia Moderately severity memory loss with dementia, likely Alzheimer's dementia vs vascular dementia.  Patient taking donepezil 5 mg once  daily and tolerating well without side effects.  Patient is now unsafe to drive a vehicle and navigate mobility after arriving at her destination.  Driving locally to well-known establishments is currently still appropriate for patient, but only if help is available once she arrives.   - Encouraged patient to consider stopping driving completely.  Rely on others to help her with shopping, restaurants. - Continue donepezil 5 mg once daily - Continue buspar - do not stop this medication.  Must take twice daily. - Continue working to keep systems in place for safety in home while maintaining independence.  Consider change to pill packs at [facility]. - Followup 3 months.   Relevant Medications   busPIRone (BUSPAR) 7.5 MG tablet     Musculoskeletal and Integument   Primary osteoarthritis of right knee Stable today on exam.  Medications tolerated without side effects.  Continue at current doses.  Continue followup at orthopedics..   . Followup 3 months.      Other   Anxiety Worsening with patient self titration down on buspirone to 7.5 mg once daily for perceived side effects  New anxiety worsening with fidgeting, restlessness, and worry.   - Continue buspirone 7.5 mg one tablet twice daily (this is 1/2 prior dose).   - Encourage regular daily activities to keep mind occupied. - Followup 3 months.   Relevant Medications   busPIRone (BUSPAR) 7.5 MG tablet      Meds ordered this encounter  Medications  . DISCONTD: busPIRone (BUSPAR) 15 MG tablet    Sig: Take 0.5 tablets (7.5 mg total) by mouth 2 (two) times daily.    Dispense:  60 tablet    Refill:  5   . busPIRone (BUSPAR) 7.5 MG tablet    Sig: Take 1 tablet (7.5 mg total) by mouth 2 (two) times daily.    Dispense:  60 tablet    Refill:  11    Please provide 7.5 mg tablets.    Order Specific Question:   Supervising Provider    Answer:   Smitty Cords [[id number]]    Follow up plan: Return in about 3 months (around 02/06/2018) for dementia.  [name], DNP, AGPCNP-BC Adult Gerontology Primary Care Nurse Practitioner [facility] 11/07/2017, 9:09 AM

## 2017-11-07 ENCOUNTER — Encounter: Payer: Self-pay | Admitting: Nurse Practitioner

## 2017-11-07 DIAGNOSIS — F0390 Unspecified dementia without behavioral disturbance: Secondary | ICD-10-CM | POA: Insufficient documentation

## 2017-11-11 ENCOUNTER — Telehealth: Payer: Self-pay | Admitting: Nurse Practitioner

## 2017-11-11 DIAGNOSIS — H9193 Unspecified hearing loss, bilateral: Secondary | ICD-10-CM | POA: Diagnosis not present

## 2017-11-11 DIAGNOSIS — M170 Bilateral primary osteoarthritis of knee: Secondary | ICD-10-CM | POA: Diagnosis not present

## 2017-11-11 DIAGNOSIS — R69 Illness, unspecified: Secondary | ICD-10-CM | POA: Diagnosis not present

## 2017-11-11 DIAGNOSIS — K5909 Other constipation: Secondary | ICD-10-CM | POA: Diagnosis not present

## 2017-11-11 DIAGNOSIS — N3000 Acute cystitis without hematuria: Secondary | ICD-10-CM | POA: Diagnosis not present

## 2017-11-11 NOTE — Telephone Encounter (Signed)
Cassandra Hahn with [name] would like to have an order for light weight, 2 wheel rolling walker sent to [facility] [phone]

## 2017-11-17 DIAGNOSIS — M1711 Unilateral primary osteoarthritis, right knee: Secondary | ICD-10-CM | POA: Diagnosis not present

## 2017-11-17 DIAGNOSIS — R269 Unspecified abnormalities of gait and mobility: Secondary | ICD-10-CM | POA: Diagnosis not present

## 2017-11-18 DIAGNOSIS — N3000 Acute cystitis without hematuria: Secondary | ICD-10-CM | POA: Diagnosis not present

## 2017-11-18 DIAGNOSIS — M170 Bilateral primary osteoarthritis of knee: Secondary | ICD-10-CM | POA: Diagnosis not present

## 2017-11-18 DIAGNOSIS — K5909 Other constipation: Secondary | ICD-10-CM | POA: Diagnosis not present

## 2017-11-18 DIAGNOSIS — R69 Illness, unspecified: Secondary | ICD-10-CM | POA: Diagnosis not present

## 2017-11-18 DIAGNOSIS — H9193 Unspecified hearing loss, bilateral: Secondary | ICD-10-CM | POA: Diagnosis not present

## 2017-12-03 ENCOUNTER — Encounter: Payer: Self-pay | Admitting: Nurse Practitioner

## 2017-12-07 ENCOUNTER — Ambulatory Visit (INDEPENDENT_AMBULATORY_CARE_PROVIDER_SITE_OTHER): Payer: Medicare HMO

## 2017-12-07 DIAGNOSIS — Z23 Encounter for immunization: Secondary | ICD-10-CM | POA: Diagnosis not present

## 2017-12-19 ENCOUNTER — Other Ambulatory Visit: Payer: Self-pay | Admitting: Nurse Practitioner

## 2017-12-19 DIAGNOSIS — R413 Other amnesia: Secondary | ICD-10-CM

## 2017-12-19 DIAGNOSIS — F0390 Unspecified dementia without behavioral disturbance: Secondary | ICD-10-CM

## 2018-01-01 DIAGNOSIS — H6123 Impacted cerumen, bilateral: Secondary | ICD-10-CM | POA: Diagnosis not present

## 2018-01-01 DIAGNOSIS — H903 Sensorineural hearing loss, bilateral: Secondary | ICD-10-CM | POA: Diagnosis not present

## 2018-01-05 ENCOUNTER — Other Ambulatory Visit: Payer: Self-pay | Admitting: Nurse Practitioner

## 2018-01-05 DIAGNOSIS — K219 Gastro-esophageal reflux disease without esophagitis: Secondary | ICD-10-CM

## 2018-01-08 DEATH — deceased

## 2018-01-20 DIAGNOSIS — M79671 Pain in right foot: Secondary | ICD-10-CM | POA: Diagnosis not present

## 2018-01-20 DIAGNOSIS — M1711 Unilateral primary osteoarthritis, right knee: Secondary | ICD-10-CM | POA: Diagnosis not present

## 2018-01-20 DIAGNOSIS — M7671 Peroneal tendinitis, right leg: Secondary | ICD-10-CM | POA: Diagnosis not present

## 2018-01-23 DIAGNOSIS — R69 Illness, unspecified: Secondary | ICD-10-CM | POA: Diagnosis not present

## 2018-01-23 DIAGNOSIS — I10 Essential (primary) hypertension: Secondary | ICD-10-CM | POA: Diagnosis not present

## 2018-01-23 DIAGNOSIS — M7671 Peroneal tendinitis, right leg: Secondary | ICD-10-CM | POA: Diagnosis not present

## 2018-01-23 DIAGNOSIS — E7800 Pure hypercholesterolemia, unspecified: Secondary | ICD-10-CM | POA: Diagnosis not present

## 2018-01-23 DIAGNOSIS — M170 Bilateral primary osteoarthritis of knee: Secondary | ICD-10-CM | POA: Diagnosis not present

## 2018-01-28 DIAGNOSIS — E7800 Pure hypercholesterolemia, unspecified: Secondary | ICD-10-CM | POA: Diagnosis not present

## 2018-01-28 DIAGNOSIS — M7671 Peroneal tendinitis, right leg: Secondary | ICD-10-CM | POA: Diagnosis not present

## 2018-01-28 DIAGNOSIS — I10 Essential (primary) hypertension: Secondary | ICD-10-CM | POA: Diagnosis not present

## 2018-01-28 DIAGNOSIS — M170 Bilateral primary osteoarthritis of knee: Secondary | ICD-10-CM | POA: Diagnosis not present

## 2018-01-28 DIAGNOSIS — R69 Illness, unspecified: Secondary | ICD-10-CM | POA: Diagnosis not present

## 2018-02-06 DIAGNOSIS — R69 Illness, unspecified: Secondary | ICD-10-CM | POA: Diagnosis not present

## 2018-02-06 DIAGNOSIS — E7800 Pure hypercholesterolemia, unspecified: Secondary | ICD-10-CM | POA: Diagnosis not present

## 2018-02-06 DIAGNOSIS — M170 Bilateral primary osteoarthritis of knee: Secondary | ICD-10-CM | POA: Diagnosis not present

## 2018-02-06 DIAGNOSIS — M7671 Peroneal tendinitis, right leg: Secondary | ICD-10-CM | POA: Diagnosis not present

## 2018-02-06 DIAGNOSIS — I10 Essential (primary) hypertension: Secondary | ICD-10-CM | POA: Diagnosis not present

## 2018-02-11 DIAGNOSIS — R69 Illness, unspecified: Secondary | ICD-10-CM | POA: Diagnosis not present

## 2018-02-11 DIAGNOSIS — E7800 Pure hypercholesterolemia, unspecified: Secondary | ICD-10-CM | POA: Diagnosis not present

## 2018-02-11 DIAGNOSIS — I10 Essential (primary) hypertension: Secondary | ICD-10-CM | POA: Diagnosis not present

## 2018-02-11 DIAGNOSIS — M170 Bilateral primary osteoarthritis of knee: Secondary | ICD-10-CM | POA: Diagnosis not present

## 2018-02-11 DIAGNOSIS — M7671 Peroneal tendinitis, right leg: Secondary | ICD-10-CM | POA: Diagnosis not present

## 2018-02-18 DIAGNOSIS — R69 Illness, unspecified: Secondary | ICD-10-CM | POA: Diagnosis not present

## 2018-02-18 DIAGNOSIS — M7671 Peroneal tendinitis, right leg: Secondary | ICD-10-CM | POA: Diagnosis not present

## 2018-02-18 DIAGNOSIS — E7800 Pure hypercholesterolemia, unspecified: Secondary | ICD-10-CM | POA: Diagnosis not present

## 2018-02-18 DIAGNOSIS — I10 Essential (primary) hypertension: Secondary | ICD-10-CM | POA: Diagnosis not present

## 2018-02-18 DIAGNOSIS — M170 Bilateral primary osteoarthritis of knee: Secondary | ICD-10-CM | POA: Diagnosis not present

## 2018-03-16 ENCOUNTER — Encounter: Payer: Self-pay | Admitting: Nurse Practitioner
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
## 2018-04-10 DEATH — deceased
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
